# Patient Record
Sex: Female | Born: 1950 | Race: White | Hispanic: No | Smoking: Former smoker
Health system: Southern US, Community
[De-identification: ages and names within clinical notes are randomized; demographics above are authoritative.]

## PROBLEM LIST (undated history)

## (undated) DIAGNOSIS — F32A Depression, unspecified: Secondary | ICD-10-CM

## (undated) DIAGNOSIS — M2040 Other hammer toe(s) (acquired), unspecified foot: Secondary | ICD-10-CM

## (undated) DIAGNOSIS — F419 Anxiety disorder, unspecified: Secondary | ICD-10-CM

## (undated) DIAGNOSIS — C4492 Squamous cell carcinoma of skin, unspecified: Secondary | ICD-10-CM

## (undated) DIAGNOSIS — M797 Fibromyalgia: Secondary | ICD-10-CM

## (undated) DIAGNOSIS — F329 Major depressive disorder, single episode, unspecified: Secondary | ICD-10-CM

## (undated) DIAGNOSIS — K219 Gastro-esophageal reflux disease without esophagitis: Secondary | ICD-10-CM

## (undated) DIAGNOSIS — Z973 Presence of spectacles and contact lenses: Secondary | ICD-10-CM

## (undated) DIAGNOSIS — E039 Hypothyroidism, unspecified: Secondary | ICD-10-CM

## (undated) DIAGNOSIS — M1990 Unspecified osteoarthritis, unspecified site: Secondary | ICD-10-CM

## (undated) HISTORY — PX: COLONOSCOPY: SHX174

## (undated) HISTORY — PX: TONSILLECTOMY: SUR1361

## (undated) HISTORY — DX: Squamous cell carcinoma of skin, unspecified: C44.92

## (undated) HISTORY — DX: Other hammer toe(s) (acquired), unspecified foot: M20.40

## (undated) HISTORY — PX: HAMMER TOE SURGERY: SHX385

## (undated) HISTORY — PX: TAYLOR BUNIONECTOMY: SHX2485

---

## 1997-12-12 ENCOUNTER — Ambulatory Visit (HOSPITAL_COMMUNITY): Admission: RE | Admit: 1997-12-12 | Discharge: 1997-12-12 | Payer: Self-pay | Admitting: Orthopedic Surgery

## 1999-05-04 ENCOUNTER — Other Ambulatory Visit: Admission: RE | Admit: 1999-05-04 | Discharge: 1999-05-04 | Payer: Self-pay | Admitting: Obstetrics & Gynecology

## 2000-09-22 ENCOUNTER — Other Ambulatory Visit: Admission: RE | Admit: 2000-09-22 | Discharge: 2000-09-22 | Payer: Self-pay | Admitting: Obstetrics & Gynecology

## 2001-10-30 ENCOUNTER — Other Ambulatory Visit: Admission: RE | Admit: 2001-10-30 | Discharge: 2001-10-30 | Payer: Self-pay | Admitting: Obstetrics & Gynecology

## 2002-11-14 ENCOUNTER — Ambulatory Visit (HOSPITAL_COMMUNITY): Admission: RE | Admit: 2002-11-14 | Discharge: 2002-11-14 | Payer: Self-pay | Admitting: Obstetrics & Gynecology

## 2002-11-14 ENCOUNTER — Encounter (INDEPENDENT_AMBULATORY_CARE_PROVIDER_SITE_OTHER): Payer: Self-pay

## 2002-11-25 ENCOUNTER — Other Ambulatory Visit: Admission: RE | Admit: 2002-11-25 | Discharge: 2002-11-25 | Payer: Self-pay | Admitting: Obstetrics & Gynecology

## 2003-05-05 ENCOUNTER — Encounter (INDEPENDENT_AMBULATORY_CARE_PROVIDER_SITE_OTHER): Payer: Self-pay | Admitting: Specialist

## 2003-05-06 ENCOUNTER — Observation Stay (HOSPITAL_COMMUNITY): Admission: RE | Admit: 2003-05-06 | Discharge: 2003-05-07 | Payer: Self-pay | Admitting: Obstetrics & Gynecology

## 2004-04-06 ENCOUNTER — Other Ambulatory Visit: Admission: RE | Admit: 2004-04-06 | Discharge: 2004-04-06 | Payer: Self-pay | Admitting: Obstetrics & Gynecology

## 2004-04-20 ENCOUNTER — Encounter: Admission: RE | Admit: 2004-04-20 | Discharge: 2004-04-20 | Payer: Self-pay | Admitting: Gastroenterology

## 2004-08-22 HISTORY — PX: ABDOMINAL HYSTERECTOMY: SHX81

## 2005-06-02 ENCOUNTER — Other Ambulatory Visit: Admission: RE | Admit: 2005-06-02 | Discharge: 2005-06-02 | Payer: Self-pay | Admitting: Obstetrics & Gynecology

## 2007-08-23 HISTORY — PX: TOTAL KNEE ARTHROPLASTY: SHX125

## 2007-08-23 HISTORY — PX: JOINT REPLACEMENT: SHX530

## 2007-10-26 ENCOUNTER — Encounter: Admission: RE | Admit: 2007-10-26 | Discharge: 2007-10-26 | Payer: Self-pay

## 2008-04-15 ENCOUNTER — Inpatient Hospital Stay (HOSPITAL_COMMUNITY): Admission: RE | Admit: 2008-04-15 | Discharge: 2008-04-17 | Payer: Self-pay | Admitting: Orthopedic Surgery

## 2008-05-19 ENCOUNTER — Encounter: Admission: RE | Admit: 2008-05-19 | Discharge: 2008-05-19 | Payer: Self-pay | Admitting: Orthopedic Surgery

## 2008-05-26 ENCOUNTER — Inpatient Hospital Stay (HOSPITAL_COMMUNITY): Admission: RE | Admit: 2008-05-26 | Discharge: 2008-05-28 | Payer: Self-pay | Admitting: Orthopedic Surgery

## 2011-01-04 NOTE — Op Note (Signed)
NAME:  Felicia Cooper, Felicia Cooper NO.:  [id number]   MEDICAL RECORD NO.:  [id number]          PATIENT TYPE:  INP   LOCATION:  1606                         FACILITY:  [facility]   PHYSICIAN:  Madlyn Frankel. Charlann Boxer, M.D.  DATE OF BIRTH:  September 10, 1950   DATE OF PROCEDURE:  05/26/2008  DATE OF DISCHARGE:                               OPERATIVE REPORT   PREOPERATIVE DIAGNOSIS:  Right knee osteoarthritis.   POSTOPERATIVE DIAGNOSIS:  Right knee osteoarthritis.   PROCEDURE:  Right total knee replacement utilizing DePuy rotating  platform posterior stabilized knee component size 2 femur, 1.5 tibial  tray, 12.5  polyethylene insert to match the size 2 femur, 38 patellar  button.   SURGEON:  Madlyn Frankel. Charlann Boxer, M.D.   ASSISTANT:  Yetta Glassman. Loreta Ave, PA   ANESTHESIA:  Duramorph spinal.   DRAINS:  Times one Hemovac.   COMPLICATIONS:  None.   TOURNIQUET TIME:  40 minutes at 250 mmHg.   INDICATIONS:  Felicia Cooper is a 60 year old patient of mine with end-stage  bilateral knee osteoarthritis, predominantly patellofemoral joint.  She  had successfully underwent a left total knee replacement.  Given the  failure to respond to conservative measures as with the left side, she  wished to proceed with a right total knee replacement.  Risks and  benefits were reviewed  and prior consent obtained.   PROCEDURE IN DETAIL:  The patient was brought to operative theater.  Once adequate anesthesia, preoperative antibiotics, Ancef administered,  the patient was positioned supine with the right thigh tourniquet  placed.  The right lower extremity was then pre-scrubbed and prepped and  draped in a sterile fashion.  The leg was exsanguinated, tourniquet  elevated.  A midline incision was made followed by a median arthrotomy.  Following initial debridement, attention was directed to the patella.  The patient's patella was noted be significantly affected with the  lateral facet very sclerotic and grooved and  definitely less than 10 mm  in thickness.  The medial facet had a bit of a ridge and its height was  only 17 mm.  I resected down the medial facet giving good platform to  place a button on to a height of about 13 mm.  The 38 patellar button  fit well.  The lug holes were drilled.  There was a lug hole made into  the lateral facet, insurance and adequate stability.  A metal shim was  placed to protect this cut surface of the patella as I tended to the  remainder of the knee.   The femoral canal was opened with a drill and irrigated to prevent fat  emboli.  An intramedullary rod was then placed at 3 degrees of valgus.  I resected 10 mm of bone off the distal femur.   The tibia was then subluxated anteriorly and with an extramedullary rod,  I resected 10 mm of bone off the proximal lateral tibia.  At this point,  I checked with an extension block and was happy that knee was out in  extension and was again probably going to be a 12.5 poly as  I had used  in the contralateral lower extremity.   At this point, we went back to the femur, setting the rotation base off  the proximal cut surface of the tibia which was noted to be  perpendicular based on the alignment rod.   Once the rotation was set and pins placed, the final cutting block for  the size 2 femur was then placed anterior-posterior  and chamfer cuts  were made without difficulty nor notching.  A final box cut was made  based off the lateral aspect of the distal femur using the trial femur  as a guide.   Attention was now redirected back to the tibia and the tibia was  prepared for the size 1.5 tibial tray.  It was oriented into the medial  third of the tubercle, drilled and keel punched.  A trial reduction was  now carried out with the 2 femur, 1.5 tibia and at 12.5 insert, the knee  came to full extension.  The patella tracked without any application of  pressure through the trochlea.   All trial components were removed.  The  synovial capsule junction was  injected with 60 mL of 0.25% Marcaine with epinephrine and  1 mL of  Toradol.  The knee was then irrigated with normal saline pulse lavage.  Final components were opened and the cement mixed.   Final components were cemented into position and knee brought to  extension with the 12.5 trial insert in place.  Extruded cement was  removed.  Once the cement had cured, I removed the remaining cement out  the posterior aspect of the knee and placed the final 12.5 insert to  match the 2 femur.  The knee was re-irrigated and the Hemovac drain was  placed deep.  The extensor mechanism was then reapproximated using #1  Vicryl with the knee in flexion.  The remainder of the wound was closed  with 2-0 Vicryl and a running 4-0 Monocryl.  The knee was cleaned, dried  and dressed sterilely with Steri-Strips and a sterile bulky wrap.  She  was brought to the recovery room in stable condition tolerating the  procedure well.      Madlyn Frankel Charlann Boxer, M.D.  Electronically Signed     MDO/MEDQ  D:  05/26/2008  T:  05/26/2008  Job:  [id number]

## 2011-01-04 NOTE — H&P (Signed)
NAME:  Felicia Cooper, Felicia Cooper NO.:  [id number]   MEDICAL RECORD NO.:  [id number]          PATIENT TYPE:  INP   LOCATION:  NA                           [facility]ACILITY:  [facility]   PHYSICIAN:  Madlyn Frankel. Charlann Boxer, M.D.  DATE OF BIRTH:  [birth year]   DATE OF ADMISSION:  05/26/2008  DATE OF DISCHARGE:                              HISTORY & PHYSICAL   PROCEDURE:  Right total knee replacement.   SURGEON:  Madlyn Frankel. Charlann Boxer, M.D.   CHIEF COMPLAINT:  Right knee pain.   HISTORY OF PRESENT ILLNESS:  A 60 year old female with a history of  right knee pain secondary to osteoarthritis.  Refractory to all  conservative treatment.  She does have a recent history of a left total  knee replacement and is doing well.  She had been previously pre-  surgically assessed by her primary care physician, Dr. Henrine Screws.   PAST MEDICAL HISTORY:  1. Osteoarthritis.  2. Fibromyalgia.  3. Postmenopausal.  4. Hyperthyroidism.   PAST SURGICAL HISTORY:  1. Left total knee replacement in August, 2009.  2. Hysterectomy.  3. Hammer toe surgery.   FAMILY HISTORY:  Coronary artery disease.   SOCIAL HISTORY:  Divorced.  Primary caregiver will be in the home.   ALLERGIES:  No known drug allergies.   MEDICATIONS:  1. Wellbutrin 150 mg p.o. b.i.d.  2. Robaxin 500 mg p.o. q.6-8h. p.r.n. muscle spasm pain.  3. Gabapentin 300 mg p.o. b.i.d.  4. Paxil 25 mg p.o. daily.  5. Cenestin 1.25 mg 1 p.o. daily.  6. Celebrex 200 mg p.o. b.i.d.  7. Synthroid 0.075 mg 1 p.o. daily.  8. Aspirin 81 mg p.o. daily.   REVIEW OF SYSTEMS:  See HPI.   PHYSICAL EXAMINATION:  Pulse 72, respirations 16, blood pressure 124/82.  Height 5 feet.  Weight 185 pounds.  GENERAL:  Awake, alert and oriented.  Well-developed and well-nourished,  no acute distress.  NECK:  Supple.  No carotid bruits.  CHEST:  Lungs are clear to auscultation bilaterally.  BREASTS:  Deferred.  CARDIOVASCULAR:  Regular rate and rhythm.  S1 and S2  distinct.  ABDOMEN:  Soft, nontender, nondistended.  Bowel sounds present.  GENITOURINARY:  Deferred.  EXTREMITIES:  Right knee with crepitation and increased pain over the  patellofemoral region.  SKIN:  No cellulitis.  NEUROLOGIC:  Intact distal sensibilities.   LABORATORY DATA:  Labs, EKG, chest x-ray all pending pre-surgical  testing.  Recent EKG and chest x-ray performed prior to left total knee  replacement.   IMPRESSION:  Right knee osteoarthritis.   PLAN:  1. Right total knee arthroplasty at [facility] on May 26, 2008.  The risks and complications were discussed.  2. Postoperative medications including Lovenox, Robaxin, iron,      aspirin, Colace, and MiraLax were provided at the time of the      history and physical.     ______________________________  Yetta Glassman. Loreta Ave, [place]      Madlyn Frankel. Charlann Boxer, M.D.  Electronically Signed    BLM/MEDQ  D:  05/19/2008  T:  05/19/2008  Job:  [id number]   cc:   Chales Salmon. Abigail Miyamoto, M.D.  Fax: [phone]   Areatha Keas, M.D.  Fax: [phone]   W. Varney Baas, M.D.  Fax: [phone]

## 2011-01-04 NOTE — Op Note (Signed)
NAME:  TRELLIS, GUIRGUIS NO.:  [id number]   MEDICAL RECORD NO.:  [id number]          PATIENT TYPE:  INP   LOCATION:  0005                         FACILITY:  [facility]   PHYSICIAN:  Madlyn Frankel. Charlann Boxer, M.D.  DATE OF BIRTH:  Dec 21, 1950   DATE OF PROCEDURE:  04/15/2008  DATE OF DISCHARGE:                               OPERATIVE REPORT   PREOPERATIVE DIAGNOSIS:  Left knee osteoarthritis, predominantly  patellofemoral in a 60 year old female.   POSTOPERATIVE DIAGNOSIS:  Left knee osteoarthritis, predominantly  patellofemoral in a 60 year old female.   PROCEDURE:  Left total knee replacement.   COMPONENTS USED:  DePuy rotating platform posterior stabilized knee  system, size 2 femur, size 1.5 tibial tray with a 12.5 insert and a 38  patellar button.   SURGEON:  Madlyn Frankel. Charlann Boxer, M.D.   ASSISTANT:  Yetta Glassman. Mann, PA.   ANESTHESIA:  Duramorph spinal.   DRAINS:  Hemovac x1.   TOURNIQUET TIME:  Forty-two minutes at 250 mmHg.   BLOOD LOSS:  50 cc.   INDICATIONS FOR PROCEDURE:  Ms. Bowne is a 60 year old female who I  have been following for some time in the office for bilateral knee pain  and arthritis.   She has had persistent recurring problems despite conservative attempts  at managing it with cortisone injections and viscosupplementation.   I had a lengthy discussion with her regarding the surgical options, at  60 years of age and based on the amount of arthritis that is present in  this patellofemoral  and lateral compartment, I was worried about  anything other than total knee replacement versus a patellofemoral  replacement  or resurfacing.  The risks and benefits of this surgery  were discussed, including the risk of infection, DVT, component failure,  postoperative complaints for stiffness, the need for therapy.  Consent  was obtained.   PROCEDURE IN DETAIL:  Patient is brought to the operating theater.  Once  adequate anesthesia with preoperative  antibiotics, Ancef, were  administered, the patient was positioned supine on the left side.  A  tourniquet was placed through the left lower extremity, which was then  prepped and draped in a sterile fashion.  A midline incision was made  followed by a modified arthrotomy with patellar subluxation rather than  eversion.  Following initial  debridement,  I first attended  to the  patella.  Her patellar arthritis was very significant.  The precut  measurement was only about 16 mm at the high point with the lower  portion significantly affected by arthritis.  I used the saw and took it  down to about 13 mm, leaving a lot of bone, which we then subsequently  drilled the lug hole in addtion to smaller holes to help with cement  interdigiation.  It measured to be a 38 patellar button, and I drilled  the lug holes and then checked with my caliper and the patellar height  was restored to 23mm, which would be average for a female.   I kept this button in place to protect the cut surface from retractors  and the saw  blade   Attention was now directed to the femur.  The central canal was opened,  irrigated.  The intramedullary rod was placed.  At 3 degrees of valgus.  I resected 9 mm off the distal femur due to some preoperatative  hyperaxtension noted on exam under anesthesia.   Following this cut, I moved to the tibia.  The tibia was subluxated  anteriorly.  A meniscectomy was carried out, followed by removal of the  cruciate stumps.  Using an extramedullary guide, I resected 10 mm of  bone off the  proximal lateral tibia.  I then checked  with an extension  block to make sure a size 10 block would fit.   I now returned to the femur.  We sized the femur to be a size 2.  I then  placed an intramedullary rod to set  my rotation off the perpendicular  plane of the proximal tibia, which was checked with the appropriate jig.  The pins were placed.  The 4-in-1 cutting block was then slid over it.   I ended up dropping it down a little bit posteriorly to prevent  overstuffing it anteriorly and then the anterior, posterior  and chamfer  cuts were then made without notching.   We then made the box cut based off the lateral aspect of the distal  femur, making sure there is going to be no overhang.  Attention was then  redirected to the tibia.  The tibia was subluxated anteriorly.  I placed  a 2 tray initially, pinned it in place and then drilled and keel-punched  it.  A trial reduction is carried out.  I was happy with the size 12.5  poly.  The patella was noted to track through the trochlea without any  tilt and without pressure needed.   At this point, all trial components were removed.  We  irrigated the  knee with normal saline solution, pulse lavage, and injected  60 cc of  Marcaine with epinephrine and 1cc of toradol.  The cement was mixed as  the final components opened.  The final components were cemented into  position with extruded cement removed with the knee in extension.  Once  the cement cured and excessive cement was removed, I placed a final 12.5  x 2 x 2 insert.  Please note that this final tibial insert was a 1.5  even though the trial was a 2 as there was no trial available.  The 1.5  sat on the tibial tray without any overhang.   The knee was reirrigated, the tourniquet was let down at 43 minutes.  A  hemovac drain was placed deep.  The extensor mechanism was then  reapproximated using a #1 Vicryl with the knee in flexion, and the sub-G  layer with a 2-0 vicryl, and a running 4-0 Monocryl.  The knee was  cleaned, dried, and dressed sterilely.  Steri-Strips  and a sterile  bulky  dressing.  She was brought to the recovery room in stable  condition, having tolerated the procedure well.      Madlyn Frankel Charlann Boxer, M.D.  Electronically Signed     MDO/MEDQ  D:  04/15/2008  T:  04/15/2008  Job:  [id number]

## 2011-01-04 NOTE — H&P (Signed)
NAME:  Felicia Cooper, Felicia Cooper NO.:  [id number]   MEDICAL RECORD NO.:  [id number]          PATIENT TYPE:  INP   LOCATION:  NA                           FACILITY:  [facility]   PHYSICIAN:  Madlyn Frankel. Charlann Boxer, M.D.  DATE OF BIRTH:  1950-10-11   DATE OF ADMISSION:  04/15/2008  DATE OF DISCHARGE:                              HISTORY & PHYSICAL   PROCEDURE:  Left total knee arthroplasty.   CHIEF COMPLAINT:  Left knee pain.   HISTORY OF PRESENT ILLNESS:  A 60 year old female with a history of left  knee pain secondary to osteoarthritis.  It has been refractory to all  conservative treatment.  She has been previously assessed by her primary  care physician, Chales Salmon. Abigail Miyamoto, M.D.   ALLERGIES:  No known drug allergies.   CURRENT MEDICATIONS:  1. Wellbutrin 150 mg p.o. b.i.d.  2. Flexeril 10 mg half p.o. b.i.d. and 1 p.o. nightly.  3. Gabapentin 300 mg one p.o. b.i.d.  4. Paxil 25 mg one p.o. daily.  5. Canesten 1.25 mg 1 p.o. daily.  6. Meloxicam 50 mg 1 p.o. b.i.d.  7. Synthroid 0.075 mg 1 p.o. daily.   PAST MEDICAL HISTORY:  1. Osteoarthritis.  2. Fibromyalgia.  3. Postmenopausal.  4. Hypothyroidism.   PAST SURGICAL HISTORY:  1. Hysterectomy.  2. Hammertoe surgery.  3. Toe surgery in 1991.   FAMILY HISTORY:  Coronary artery disease   SOCIAL HISTORY:  Divorced.  Primary caregiver will be in the home  postoperatively.   REVIEW OF SYSTEMS:  See HPI.   PHYSICAL EXAMINATION:  VITAL SIGNS:  Pulse 72, respiratory rate 16,  blood pressure 116/78.  Height 5 feet, weight 185.  GENERAL APPEARANCE:  Awake, alert and oriented, well-developed, well-  nourished, no acute distress.  NECK:  Supple, no carotid bruits.  CHEST:  Lungs clear to auscultation bilaterally.  BREASTS:  Deferred.  CARDIOVASCULAR:  Regular rate and rhythm, S1 and S2 distinct.  ABDOMEN:  Soft, nontender, nondistended.  Bowel sounds present.  GENITOURINARY:  Deferred.  EXTREMITIES:  Left knee with  crepitation and increased pain of the  patellofemoral area.  SKIN:  No cellulitis.  NEUROLOGIC:  Intact distal sensibilities.   Labs, EKG and chest x-ray are all pending presurgical testing.   IMPRESSION:  Left knee osteoarthritis.   PLAN:  Left total knee arthroplasty at [facility] on April 15, 2008, by surgeon, Madlyn Frankel. Charlann Boxer, M.D.  Risks and complications  were discussed.   Postoperative medications including Lovenox, Robaxin, iron, aspirin,  Colace, MiraLax provided at the time of history and physical.  No pain  medicines provided, will be dispensed at time of surgery.     ______________________________  Yetta Glassman Loreta Ave, [place]      Madlyn Frankel. Charlann Boxer, M.D.  Electronically Signed    BLM/MEDQ  D:  04/04/2008  T:  04/04/2008  Job:  [id number]   cc:   Chales Salmon. Abigail Miyamoto, M.D.  Fax: [phone]   Areatha Keas, M.D.  Fax: [phone]   W. Varney Baas, M.D.  Fax: [phone]

## 2011-01-07 NOTE — Discharge Summary (Signed)
   NAME:  Felicia Cooper, Felicia Cooper                         ACCOUNT NO.:  [id number]   MEDICAL RECORD NO.:  [id number]                   PATIENT TYPE:  OBV   LOCATION:  9308                                 FACILITY:  [facility]   PHYSICIAN:  Freddy Finner, M.D.                DATE OF BIRTH:  1950-12-06   DATE OF ADMISSION:  05/06/2003  DATE OF DISCHARGE:  05/07/2003                                 DISCHARGE SUMMARY   DISCHARGE DIAGNOSES:  Uterine leiomyomata.   OPERATIVE PROCEDURE:  Laparoscopically assisted vaginal hysterectomy,  bilateral salpingo-oophorectomy.   COMPLICATIONS:  None.   DISPOSITION:  The patient was in satisfactory, improved condition at the  time of her discharge.  She was discharged home on Cenestin 1.25 mg daily.  She was given Percocet 5/325 to be taken for postoperative pain.  She is to  follow up in the office in approximately two weeks.  She is to call for  fever, severe pain, and bleeding.  She is to avoid heavy lifting or vaginal  entry.   Details of the present illness, family history, review of systems, and  physical examination are recorded in the admission note.   PHYSICAL EXAMINATION:  PELVIC:  Remarkable for enlargement of the uterus.   LABORATORY DATA:  CBC on admission with hemoglobin 13.9, postoperative  hemoglobin 11.7.  Admission prothrombin time and PTT were normal.  Admission  urinalysis was normal.   HOSPITAL COURSE:  The patient was admitted on day of surgery.  She was  treated perioperatively with PAS hose without antibiotics.  The above  described procedure was accomplished without difficulty.  Her postoperative  course was uncomplicated.  She remained afebrile throughout.  By the evening  of the first postoperative day she was discharged home with disposition as  noted above.                                               Freddy Finner, M.D.    WRN/MEDQ  D:  05/24/2003  T:  05/26/2003  Job:  [id number]

## 2011-01-07 NOTE — Discharge Summary (Signed)
NAME:  Felicia Cooper, Felicia Cooper NO.:  [id number]   MEDICAL RECORD NO.:  [id number]          PATIENT TYPE:  INP   LOCATION:  1606                         FACILITY:  [facility]   PHYSICIAN:  Madlyn Frankel. Charlann Boxer, M.D.  DATE OF BIRTH:  [birth year]   DATE OF ADMISSION:  05/26/2008  DATE OF DISCHARGE:  05/28/2008                               DISCHARGE SUMMARY   ADMISSION DIAGNOSES:  1. Osteoarthritis.  2. Fibromyalgia.  3. Postmenopausal.  4. Hyperthyroidism.   DISCHARGE DIAGNOSES:  1. Osteoarthritis.  2. Fibromyalgia.  3. Postmenopausal.  4. Hyperthyroidism.  5. Postoperative hypokalemia.   HISTORY OF PRESENT ILLNESS:  A 60 year old female with a history of  right knee pain secondary to osteoarthritis refractory to all  conservative treatment.  A recent history of a left total knee  replacement.  Has done extremely well.   CONSULTS:  None.   PROCEDURE:  Right total knee replacement by surgeon, Dr. Durene Romans  and assistant, Dwyane Luo, PA-C.   LABORATORY DATA:  Final CBC showed her white blood cells of 5.2,  hemoglobin 10, hematocrit 29.1, and platelets of 191.  Final metabolic  panel shows sodium of 136, potassium 3.3, creatinine 0.54, glucose 103.  Coagulation normal.  UA was negative for nitrites.   RADIOLOGY:  Chest, two view, no active disease.   CARDIOLOGY:  EKG, normal sinus rhythm.   HOSPITAL COURSE:  Patient admitted to the hospital and underwent a right  total knee replacement.  Admitted to the orthopedic floor.  The first  day was unremarkable.  She remained hemodynamically stable throughout  her stay.  The dressing was changed after day #1 with findings of  minimal serosanguineous drainage.  Hemovac was discontinued on day #1.  She made good progress with physical therapy and met all criteria for  discharge by day #2.  She was given some K-Dur for some postoperative  hypokalemia on day #2.  This is mild in nature, related volume  depletion.  Home care was  selected, and she was discharged.   DISCHARGE DISPOSITION:  Discharged home with home healthcare PT in  stable condition.   DISCHARGE DIET:  Regular.   DISCHARGE WOUND CARE:  Keep dry.   DISCHARGE MEDICATIONS:  1. Lovenox 40 mg subcu q.24h. x11 days.  2. Robaxin 500 mg p.o. q.6h. muscle spasm.  3. Enteric-coated aspirin 325 mg p.o. daily after Lovenox.  4. Iron 325 mg 1 p.o. t.i.d. x1 week.  5. Colace 100 mg 1 p.o. b.i.d. p.r.n. constipation.  6. MiraLax 15 gm p.o. daily p.r.n. constipation.  7. Oxycodone 5 mg 1-3 p.o. q.3-4h. p.r.n. pain.  8. Phenergan 12.5 mg 1 p.o. q.8h. p.r.n. nausea and vomiting.  9. Wellbutrin 150 mg p.o. b.i.d.  10.Neurontin 300 mg 2 tablets nightly.  11.Paxil 25 mg 1 p.o. q.a.m.  12.Cenestin 1.25 mg 1 p.o. q.p.m.  13.Synthroid 0.075 mg or 75 mcg 1 p.o. daily.  14.Methocarbamol 500mg  PO Q.I.D prn spasm and pain.   DISCHARGE FOLLOWUP:  Follow up with Dr. Charlann Boxer at phone number [phone] in  two weeks.   DISCHARGE PHYSICAL THERAPY:  Weightbearing as  tolerated.  .     ______________________________  Yetta Glassman Loreta [address], [place]      Madlyn Frankel. Charlann Boxer, M.D.  Electronically Signed    BLM/MEDQ  D:  06/19/2008  T:  06/19/2008  Job:  [id number]   cc:   Chales Salmon. Abigail Miyamoto, M.D.  Fax: [phone]

## 2011-01-07 NOTE — Op Note (Signed)
NAME:  Felicia Cooper, Felicia Cooper                         ACCOUNT NO.:  [id number]   MEDICAL RECORD NO.:  [id number]                   PATIENT TYPE:  OBV   LOCATION:  9308                                 FACILITY:  [facility]   PHYSICIAN:  Freddy Finner, M.D.                DATE OF BIRTH:  December 06, 1950   DATE OF PROCEDURE:  05/06/2003  DATE OF DISCHARGE:                                 OPERATIVE REPORT   PREOPERATIVE DIAGNOSIS:  Fibroids, menorrhagia.   POSTOPERATIVE DIAGNOSIS:  Fibroids, menorrhagia.   OPERATIVE PROCEDURE:  Laparoscopic assisted vaginal hysterectomy, bilateral  salpingo-oophorectomy.   SURGEON:  Freddy Finner, M.D.   ASSISTANT:  Duke Salvia. Marcelle Overlie, M.D.   ESTIMATED INTRAOPERATIVE BLOOD LOSS:  150 cc.   ANESTHESIA:  General endotracheal.   INTRAOPERATIVE COMPLICATIONS:  None.   DESCRIPTION OF PROCEDURE:  The patient was admitted on the morning of  surgery. She was placed on __________. She was given a 1 g bolus of  cefotetan IV. She was brought to the operating room. She was placed under  general anesthesia, placed in a dorsal lithotomy position using the [employer] system. Betadine prep of the abdomen, perineum, and vagina was  carried out with Betadine scrub followed by Betadine solution. Bladder was  evacuated with a sterile Robinson catheter. Hulka tenaculum was attached to  the cervix without difficulty. Sterile drapes were applied. Two small  incisions were made in the abdomen, one at the umbilicus and one just above  the symphysis. An 11-mm nonbladed disposable trocar was introduced into the  umbilicus. Direct inspection revealed adequate placement and no evidence of  injury on entry. Pneumoperitoneum was allowed to accumulate using carbon  dioxide gas. A 5-mm trocar was placed through the lower incision under  direct visualization. A systematic examination of the abdominal and pelvic  contents was carried out. Appendix was visualized. There were a few little  adhesions of the appendix to the peritoneum, but overall the appendix  appeared to be normal. Pelvic structures were normal except for the presence  of fibroids which enlarged the uterus. Grasping forceps was placed in the  lower trocar sleeve, and the tube and ovary on the right side elevated and  placed in traction. Using the Gyrus tripolar device,  the infundibular  pelvic ligament and upper broad ligament on that side were progressively  fulgurated and divided to a level approximately just above the uterine  arteries. The left side was then treated in an identical fashion. Attention  was then turned vaginally. Hulka tenaculum was removed. A posterior wide  retractor was placed. Cervix was grasped with the [facility] tenaculum.  Colpotomy incision was made while tenting the mucosa posterior to the  cervix. Cervix was released from the mucosa with a scalpel. Uterosacral  ligaments were then taken with a ligature system, sealed and divided.  Bladder pillars were similarly taken, sealed and divided. Bladder was  further advanced off the cervix. Cardinal ligament pedicles were taken with  the ligature system sealed and divided. Anterior peritoneum was entered.  Vessel pedicles on each side were taken with the ligature system sealed and  divided. A second pedicle was taken above the vessels on each side. The  uterus was then delivered through the vaginal introitus. Remaining pedicle  in the patient's right was then sealed and divided, releasing the uterus  completely. Angles of the vagina were then anchored to uterosacral pedicles  with a 0 Monocryl mattress suture. Uterosacrals were plicated and posterior  peritoneum closed with an interrupted 0 Monocryl suture. Cuff was closed  vertically with figure-of-eights of 0 Monocryl. Foley catheter was placed.  Reinspection laparoscopically was carried out. All bleeding sources noted  were sealed with a Gyrus. Irrigating solution was used __________.  All  irrigating solution was aspirated. When hemostasis was achieved, all  instruments were removed, gas was allowed to escape from the abdomen. Skin  incisions were closed with interrupted subcuticular sutures of 3-0 Dexon.  Half percent plain Marcaine was injected at the incision sites for  postoperative analgesia. Steri strips were applied at the lower incision.  The patient was awakened and taken to the recovery room in good condition.                                               Freddy Finner, M.D.    WRN/MEDQ  D:  05/06/2003  T:  05/06/2003  Job:  [id number]

## 2011-01-07 NOTE — H&P (Signed)
NAME:  Felicia Cooper, Felicia Cooper                         ACCOUNT NO.:  [id number]   MEDICAL RECORD NO.:  [id number]                   PATIENT TYPE:  AMB   LOCATION:  SDC                                  FACILITY:  [facility]   PHYSICIAN:  Freddy Finner, M.D.                DATE OF BIRTH:  [birth year]   DATE OF ADMISSION:  DATE OF DISCHARGE:                                HISTORY & PHYSICAL   ANTICIPATED DATE OF ADMISSION:  May 06, 2003   ADMITTING DIAGNOSES:  1. Uterine fibroids.  2. Dysfunctional uterine bleeding unresponsive to oral contraceptives.   HISTORY OF PRESENT ILLNESS:  The patient is a 60 year old white married  female gravida 4, para 2 who has been followed in my office since 1982.  She  did have the finding of cervical dysplasia approximately 12 years ago, which  was adequately treated with cryocautery of the cervix.  She has otherwise  been normal and has had no recurrent abnormal Pap smears. She presented in  March of this year fracture her regular annual examination at which time she  complained of severe dysmenorrhea and menorrhagia.  A sonohystogram was  performed showing and endometrial polyp and showing intramural leiomyomata.  She had hysteroscopy and D&C at this hospital in March of 2004 and the  endometrial polyp was resected.  She was continued on oral contraceptives as  a way of cycling.  She has continued to have abnormal bleeding in spite of  the oral contraceptives.  She specifically requested consideration for  definitive intervention.  She is now admitted for laparoscopically assisted  vaginal hysterectomy and bilateral salpingo-oophorectomy.  She has reviewed  the video in the office.  She has been carefully counselled about the  procedure including the potential risks.  She is now admitted for surgery.   PAST MEDICAL HISTORY:  The patient has no known significant medical  illnesses, except for hypothyroidism for which she takes Synthroid.  She  also has  fibromyalgia for which she takes at the present time Prozac,  Wellbutrin and Neurontin.  She is followed for this by Dr. Britta Mccreedy.  She  has no other known significant medical illnesses.   ALLERGIES:  No known allergies to medications.   MEDICATIONS:  The above medications are her current meds.   PAST HISTORY:  The patient has never had a blood transfusion.   SOCIAL HISTORY:  The patient does not use cigarettes.  She uses alcohol only  socially.   PAST SURGICAL HISTORY:  Previous surgical procedures include the ones noted  above, also she had a T&A as a child.  She had two vaginal births.  She had  one TAB and a D&C with a spontaneous abortion in 40.   FAMILY HISTORY:  Family history is noncontributory.   PHYSICAL EXAMINATION:  HEENT:  Grossly within normal limits.  NECK:  Thyroid gland is not palpably enlarged to my examination.  VITAL SIGNS:  The patient's blood pressure in the office is 106/56.  BREASTS:  Breast exam is considered to be normal.  There are no palpable  masses.  No skin change.  No nipple discharge.  HEART:  Normal sinus rhythm without murmurs, rubs or gallops.  CHEST:  Chest is clear to auscultation.  ABDOMEN:  Abdomen is soft and nontender without appreciable organomegaly or  palpable masses on exam.  PELVIC EXAMINATION:  External genitalia:  Vagina and cervix are normal.  Bimanual reveals slightly enlargement of the uterus to deep palpation.  There are no palpable adnexal masses.  RECTAL:  The rectum is normal. Rectovaginal exam confirms the above-  findings.  EXTREMITIES:  Without cyanosis, clubbing or edema.   ASSESSMENT:  1. Uterine leiomyomata.  2. Dysfunctional uterine bleeding unresponsive to cyclic hormonal therapy.   PLAN:  Laparoscopically assisted vaginal hysterectomy and bilateral salpingo-  oophorectomy.                                                 Freddy Finner, M.D.    WRN/MEDQ  D:  05/05/2003  T:  05/06/2003  Job:   [id number]

## 2011-01-07 NOTE — Op Note (Signed)
   NAME:  Felicia Cooper, Felicia Cooper                         ACCOUNT NO.:  [id number]   MEDICAL RECORD NO.:  [id number]                   PATIENT TYPE:  AMB   LOCATION:  SDC                                  FACILITY:  [facility]   PHYSICIAN:  Freddy Finner, M.D.                DATE OF BIRTH:  February 21, 1951   DATE OF PROCEDURE:  11/14/2002  DATE OF DISCHARGE:                                 OPERATIVE REPORT   PREOPERATIVE DIAGNOSES:  1. Dysfunctional uterine bleeding.  2. Endometrial polyp by sonohysterogram.   POSTOPERATIVE DIAGNOSES:  1. Dysfunctional uterine bleeding.  2. Visualization of polyp.   OPERATIVE PROCEDURES:  1. Hysteroscopy.  2. Dilatation and curettage.  3. Resection of polyp.   SURGEON:  Freddy Finner, M.D.   ESTIMATED INTRAOPERATIVE BLOOD LOSS:  10 mL.   SORBITOL DEFICIT:  45 mL.   INTRAOPERATIVE COMPLICATIONS:  None.   INDICATIONS:  The patient is a 59 year old with dysfunctional uterine  bleeding, who had sonohysterogram showing an endometrial polyp.  She is  admitted now for hysteroscopy.   DESCRIPTION OF PROCEDURE:  She was admitted on the morning of surgery,  brought to the operating room, placed under adequate general anesthesia, and  placed in the dorsolithotomy position using the [place] stirrup system.  Betadine prep of the perineum and vagina was carried out in the usual  fashion.  Sterile drapes were applied.  A bivalve speculum was introduced.  The cervix was grasped with a single-tooth tenaculum.  The uterus sounded  8.5 cm.  The cervix was progressively dilated with Pratts to 23.  The 12.5  degree [facility] hysteroscope was introduced using Sorbitol 3% as the distending  medium.  Inspection revealed a polyp which was photographed.  The  photographs were retained in the office record.  Exploration with polyp  forceps.  Gentle thorough curettage and repeat exploration with the polyp  forceps resected the polyp and sampled the endometrium.  The material was  submitted  as one sample for hysterologic examination.  The procedure at this  point was terminated and the instruments were removed.  The patient was  awaken and taken to the recovery room in good condition.  She will be  discharged with routine outpatient surgical instructions for follow-up in  the office in approximately one week.                                               Freddy Finner, M.D.   WRN/MEDQ  D:  11/14/2002  T:  11/14/2002  Job:  [id number]

## 2011-01-07 NOTE — Discharge Summary (Signed)
NAME:  Felicia Cooper, Felicia Cooper NO.:  [id number]   MEDICAL RECORD NO.:  [id number]          PATIENT TYPE:  INP   LOCATION:  1613                         [facility]ACILITY:  [facility]   PHYSICIAN:  Madlyn Frankel. Charlann Boxer, M.D.  DATE OF BIRTH:  07-31-1951   DATE OF ADMISSION:  04/15/2008  DATE OF DISCHARGE:  04/17/2008                               DISCHARGE SUMMARY   ADMITTING DIAGNOSES:  1. Osteoarthritis.  2. Fibromyalgia.  3. Postmenopausal.  4. Hypothyroidism.   DISCHARGE DIAGNOSES:  1. Osteoarthritis.  2. Fibromyalgia.  3. Postmenopausal.  4. Hypothyroidism.  5. Postoperative hypokalemia.   HISTORY OF PRESENT ILLNESS:  A 60 year old female history of left knee  pain secondary to osteoarthritis.  Refractory to all conservative  treatment.   CONSULTATION:  None.   PROCEDURE:  Left total knee arthroplasty by surgeon Dr. Durene Romans,  assistant Dwyane Luo, PA.   LABORATORY DATA:  CBC upon admission:  Hematocrit 37.4, platelets 247.  Last reading:  Hematocrit 30.5, platelets 200 and stable.  White cell  differential all within normal limits.  Coagulation all within normal  limits.  Routine chemistry upon admission:  No significant  abnormalities.  At time of discharge her last reading was sodium 138,  potassium 3.4, glucose 107, creatinine 0.59.  Her kidney function was  good with GFR greater than 60 and the last calcium 8.5.  Her UA was  negative.   RADIOLOGY:  Chest two-view no active disease.   Cardiology EKG showed normal sinus rhythm.   HOSPITAL COURSE:  The patient underwent left total knee replacement,  admitted to Orthopedic Floor.  Her course of stay was unremarkable.  She  remained hemodynamically stable other than some mild hypokalemia which  was treated with K-Dur and resolved.  Her dressing was changed on a  daily basis after Hemovac was discontinued.  Her wound showed no sign of  infection, no serosanguineous ooze.  She remained neurovascularly intact  in the  left lower extremity throughout, was able to do straight leg  raise prior to discharge.  Seen on day 2 doing well, making due progress  in physical therapy.  Was motivated and meeting all criteria for  discharge home.   DISCHARGE DISPOSITION:  Discharged home in stable improved condition  with Home Health Care PT.   DISCHARGE PHYSICAL THERAPY:  Weightbearing as tolerated with use of a  rolling walker.   DISCHARGE DIET:  Regular.   DISCHARGE WOUND CARE:  Keep dry.   DISCHARGE MEDICATIONS:  1. Lovenox 40 mg subcutaneous q. 24 times eleven days.  2. Robaxin 5 mg p.o. q.6 p.r.n. muscle spasm/pain.  3. Iron 325 mg p.o. q.6..  4. Iron 325 mg one p.o. t.i.d. x 1 week.  5. Colace 100 mg p.o. b.i.d.  6. MiraLax 17 grams p.o. daily.  7. Vicodin 7.5/325 one to two p.o. q.4-6 p.r.n. pain.  8. Wellbutrin 150 mg p.o. b.i.d.  9. Cyclobenzaprine, hold.  10.Gabapentin 300 mg p.o. nightly.  11.Paxil 25 mg p.o. q.a.m.  12.Meloxicam 50 mg p.o. b.i.d.  13.Synthroid 0.075 mg p.o. q.a.m.  14.Estradiol 2 mg p.o. nightly.  15.Celebrex 200 mg one p.o. b.i.d. x2 weeks after surgery.   DISCHARGE FOLLOWUP:  Follow up with Dr. Charlann Boxer at phone number [phone] in  two weeks for wound check.     ______________________________  Yetta Glassman. Loreta [address], [place]      Madlyn Frankel. Charlann Boxer, M.D.  Electronically Signed    BLM/MEDQ  D:  06/03/2008  T:  06/03/2008  Job:  [id number]   cc:   Chales Salmon. Abigail Miyamoto, M.D.  Fax: [phone]   W. Varney Baas, M.D.  Fax: [phone]

## 2011-05-23 LAB — BASIC METABOLIC PANEL
BUN: 12
CO2: 30
Calcium: 9.2
GFR calc Af Amer: >60
Potassium: 3.8
Sodium: 139

## 2011-05-23 LAB — URINALYSIS, ROUTINE W REFLEX MICROSCOPIC
Hgb urine dipstick: NEGATIVE
Specific Gravity, Urine: 1.015

## 2011-05-23 LAB — CBC
MCV: 92.5
WBC: 12.9 — ABNORMAL HIGH

## 2011-05-23 LAB — DIFFERENTIAL
Basophils Absolute: 0.1
Lymphocytes Relative: 12
Neutro Abs: 10.7 — ABNORMAL HIGH
Neutrophils Relative %: 83 — ABNORMAL HIGH

## 2011-05-23 LAB — PROTIME-INR
INR: 0.9
Prothrombin Time: 12.6

## 2011-05-23 LAB — APTT: aPTT: 26

## 2011-05-24 LAB — CBC
HCT: 29.1 — ABNORMAL LOW
Hemoglobin: 10 — ABNORMAL LOW
Hemoglobin: 10.6 — ABNORMAL LOW
MCHC: 33.5
MCV: 90.9
MCV: 92.5
RBC: 3.2 — ABNORMAL LOW
RBC: 3.41 — ABNORMAL LOW
RDW: 12.8
RDW: 12.9
WBC: 5.2

## 2011-05-24 LAB — BASIC METABOLIC PANEL
BUN: 7
CO2: 31
Calcium: 8 — ABNORMAL LOW
Chloride: 97
GFR calc Af Amer: >60
Glucose, Bld: 120 — ABNORMAL HIGH
Potassium: 4
Sodium: 133 — ABNORMAL LOW

## 2011-09-27 ENCOUNTER — Other Ambulatory Visit: Payer: Self-pay | Admitting: Gastroenterology

## 2012-10-31 ENCOUNTER — Other Ambulatory Visit: Payer: Self-pay | Admitting: Physician Assistant

## 2013-04-05 ENCOUNTER — Other Ambulatory Visit: Payer: Self-pay | Admitting: Orthopedic Surgery

## 2013-04-05 ENCOUNTER — Encounter (HOSPITAL_BASED_OUTPATIENT_CLINIC_OR_DEPARTMENT_OTHER): Payer: Self-pay | Admitting: *Deleted

## 2013-04-05 NOTE — Progress Notes (Signed)
No labs needed

## 2013-04-11 ENCOUNTER — Ambulatory Visit (HOSPITAL_BASED_OUTPATIENT_CLINIC_OR_DEPARTMENT_OTHER)
Admission: RE | Admit: 2013-04-11 | Discharge: 2013-04-11 | Disposition: A | Discharge status: Home / Self Care | Payer: BC Managed Care – PPO | Source: Ambulatory Visit | Attending: Orthopedic Surgery | Admitting: Orthopedic Surgery

## 2013-04-11 ENCOUNTER — Encounter (HOSPITAL_BASED_OUTPATIENT_CLINIC_OR_DEPARTMENT_OTHER)
Admission: RE | Disposition: A | Discharge status: Home / Self Care | Payer: Self-pay | Source: Ambulatory Visit | Attending: Orthopedic Surgery

## 2013-04-11 ENCOUNTER — Encounter (HOSPITAL_BASED_OUTPATIENT_CLINIC_OR_DEPARTMENT_OTHER): Payer: Self-pay | Admitting: *Deleted

## 2013-04-11 ENCOUNTER — Ambulatory Visit (HOSPITAL_BASED_OUTPATIENT_CLINIC_OR_DEPARTMENT_OTHER): Payer: BC Managed Care – PPO | Admitting: *Deleted

## 2013-04-11 DIAGNOSIS — Z79899 Other long term (current) drug therapy: Secondary | ICD-10-CM | POA: Diagnosis not present

## 2013-04-11 DIAGNOSIS — F411 Generalized anxiety disorder: Secondary | ICD-10-CM | POA: Diagnosis not present

## 2013-04-11 DIAGNOSIS — F329 Major depressive disorder, single episode, unspecified: Secondary | ICD-10-CM | POA: Diagnosis not present

## 2013-04-11 DIAGNOSIS — F3289 Other specified depressive episodes: Secondary | ICD-10-CM | POA: Diagnosis not present

## 2013-04-11 DIAGNOSIS — E039 Hypothyroidism, unspecified: Secondary | ICD-10-CM | POA: Insufficient documentation

## 2013-04-11 DIAGNOSIS — M129 Arthropathy, unspecified: Secondary | ICD-10-CM | POA: Diagnosis not present

## 2013-04-11 DIAGNOSIS — Z87891 Personal history of nicotine dependence: Secondary | ICD-10-CM | POA: Diagnosis not present

## 2013-04-11 DIAGNOSIS — Z6837 Body mass index (BMI) 37.0-37.9, adult: Secondary | ICD-10-CM | POA: Insufficient documentation

## 2013-04-11 DIAGNOSIS — Z96659 Presence of unspecified artificial knee joint: Secondary | ICD-10-CM | POA: Diagnosis not present

## 2013-04-11 DIAGNOSIS — IMO0001 Reserved for inherently not codable concepts without codable children: Secondary | ICD-10-CM | POA: Diagnosis not present

## 2013-04-11 DIAGNOSIS — M1990 Unspecified osteoarthritis, unspecified site: Secondary | ICD-10-CM | POA: Diagnosis not present

## 2013-04-11 DIAGNOSIS — G5600 Carpal tunnel syndrome, unspecified upper limb: Secondary | ICD-10-CM | POA: Insufficient documentation

## 2013-04-11 HISTORY — DX: Hypothyroidism, unspecified: E03.9

## 2013-04-11 HISTORY — PX: CARPAL TUNNEL RELEASE: SHX101

## 2013-04-11 HISTORY — DX: Fibromyalgia: M79.7

## 2013-04-11 HISTORY — DX: Depression, unspecified: F32.A

## 2013-04-11 HISTORY — DX: Major depressive disorder, single episode, unspecified: F32.9

## 2013-04-11 HISTORY — DX: Anxiety disorder, unspecified: F41.9

## 2013-04-11 HISTORY — DX: Presence of spectacles and contact lenses: Z97.3

## 2013-04-11 HISTORY — DX: Unspecified osteoarthritis, unspecified site: M19.90

## 2013-04-11 LAB — POCT HEMOGLOBIN-HEMACUE: Hemoglobin: 13.9 g/dL (ref 12.0–15.0)

## 2013-04-11 SURGERY — CARPAL TUNNEL RELEASE
Anesthesia: General | Site: Wrist | Laterality: Left | Wound class: Clean

## 2013-04-11 MED ORDER — FENTANYL CITRATE 0.05 MG/ML IJ SOLN
INTRAMUSCULAR | Status: DC | PRN
  Administered 2013-04-11 (×2): 50 ug via INTRAVENOUS

## 2013-04-11 MED ORDER — PROPOFOL 10 MG/ML IV BOLUS
INTRAVENOUS | Status: DC | PRN
  Administered 2013-04-11: 50 mg via INTRAVENOUS
  Administered 2013-04-11: 200 mg via INTRAVENOUS

## 2013-04-11 MED ORDER — MIDAZOLAM HCL 2 MG/2ML IJ SOLN: 1.0000 mg | INTRAMUSCULAR | Status: DC | PRN

## 2013-04-11 MED ORDER — LIDOCAINE HCL 2 % IJ SOLN
INTRAMUSCULAR | Status: DC | PRN
  Administered 2013-04-11: 2 mL

## 2013-04-11 MED ORDER — ONDANSETRON HCL 4 MG/2ML IJ SOLN: 4.0000 mg | Freq: Once | INTRAMUSCULAR | Status: DC | PRN

## 2013-04-11 MED ORDER — LIDOCAINE HCL (CARDIAC) 20 MG/ML IV SOLN
INTRAVENOUS | Status: DC | PRN
  Administered 2013-04-11: 80 mg via INTRAVENOUS

## 2013-04-11 MED ORDER — FENTANYL CITRATE 0.05 MG/ML IJ SOLN: 50.0000 ug | INTRAMUSCULAR | Status: DC | PRN

## 2013-04-11 MED ORDER — MIDAZOLAM HCL 5 MG/5ML IJ SOLN
INTRAMUSCULAR | Status: DC | PRN
  Administered 2013-04-11: 1 mg via INTRAVENOUS

## 2013-04-11 MED ORDER — OXYCODONE-ACETAMINOPHEN 5-325 MG PO TABS
ORAL_TABLET | ORAL | Status: DC
Start: 2013-04-11 — End: 2013-06-10

## 2013-04-11 MED ORDER — OXYCODONE HCL 5 MG/5ML PO SOLN: 5.0000 mg | Freq: Once | ORAL | Status: DC | PRN

## 2013-04-11 MED ORDER — CHLORHEXIDINE GLUCONATE 4 % EX LIQD: 60.0000 mL | Freq: Once | CUTANEOUS | Status: DC

## 2013-04-11 MED ORDER — LACTATED RINGERS IV SOLN
INTRAVENOUS | Status: DC
  Administered 2013-04-11 (×2): via INTRAVENOUS

## 2013-04-11 MED ORDER — HYDROMORPHONE HCL PF 1 MG/ML IJ SOLN: 0.2500 mg | INTRAMUSCULAR | Status: DC | PRN

## 2013-04-11 MED ORDER — ONDANSETRON HCL 4 MG/2ML IJ SOLN
INTRAMUSCULAR | Status: DC | PRN
  Administered 2013-04-11: 4 mg via INTRAVENOUS

## 2013-04-11 MED ORDER — OXYCODONE HCL 5 MG PO TABS: 5.0000 mg | ORAL_TABLET | Freq: Once | ORAL | Status: DC | PRN

## 2013-04-11 MED ORDER — DEXAMETHASONE SODIUM PHOSPHATE 10 MG/ML IJ SOLN
INTRAMUSCULAR | Status: DC | PRN
  Administered 2013-04-11: 10 mg via INTRAVENOUS

## 2013-04-11 SURGICAL SUPPLY — 42 items
BANDAGE ADHESIVE 1X3 (GAUZE/BANDAGES/DRESSINGS) IMPLANT
BANDAGE ELASTIC 3 VELCRO ST LF (GAUZE/BANDAGES/DRESSINGS) ×2 IMPLANT
BLADE SURG 15 STRL LF DISP TIS (BLADE) ×1 IMPLANT
BLADE SURG 15 STRL SS (BLADE) ×2
BNDG CMPR 9X4 STRL LF SNTH (GAUZE/BANDAGES/DRESSINGS) ×1
BNDG ESMARK 4X9 LF (GAUZE/BANDAGES/DRESSINGS) ×1 IMPLANT
BRUSH SCRUB EZ PLAIN DRY (MISCELLANEOUS) ×2 IMPLANT
CLOTH BEACON ORANGE TIMEOUT ST (SAFETY) ×2 IMPLANT
CORDS BIPOLAR (ELECTRODE) ×1 IMPLANT
COVER MAYO STAND STRL (DRAPES) ×2 IMPLANT
COVER TABLE BACK 60X90 (DRAPES) ×2 IMPLANT
CUFF TOURNIQUET SINGLE 18IN (TOURNIQUET CUFF) ×1 IMPLANT
DECANTER SPIKE VIAL GLASS SM (MISCELLANEOUS) IMPLANT
DRAPE EXTREMITY T 121X128X90 (DRAPE) ×2 IMPLANT
DRAPE SURG 17X23 STRL (DRAPES) ×2 IMPLANT
GLOVE BIOGEL M STRL SZ7.5 (GLOVE) ×1 IMPLANT
GLOVE BIOGEL PI IND STRL 7.0 (GLOVE) IMPLANT
GLOVE BIOGEL PI INDICATOR 7.0 (GLOVE) ×1
GLOVE ECLIPSE 6.5 STRL STRAW (GLOVE) ×1 IMPLANT
GLOVE EXAM NITRILE LRG STRL (GLOVE) ×1 IMPLANT
GLOVE ORTHO TXT STRL SZ7.5 (GLOVE) ×2 IMPLANT
GOWN BRE IMP PREV XXLGXLNG (GOWN DISPOSABLE) ×3 IMPLANT
GOWN PREVENTION PLUS XLARGE (GOWN DISPOSABLE) ×2 IMPLANT
NDL SAFETY ECLIPSE 18X1.5 (NEEDLE) IMPLANT
NEEDLE 27GAX1X1/2 (NEEDLE) ×1 IMPLANT
NEEDLE HYPO 18GX1.5 SHARP (NEEDLE) ×2
PACK BASIN DAY SURGERY FS (CUSTOM PROCEDURE TRAY) ×2 IMPLANT
PAD CAST 3X4 CTTN HI CHSV (CAST SUPPLIES) ×1 IMPLANT
PADDING CAST ABS 4INX4YD NS (CAST SUPPLIES)
PADDING CAST ABS COTTON 4X4 ST (CAST SUPPLIES) ×1 IMPLANT
PADDING CAST COTTON 3X4 STRL (CAST SUPPLIES) ×2
SPLINT PLASTER CAST XFAST 3X15 (CAST SUPPLIES) ×5 IMPLANT
SPLINT PLASTER XTRA FASTSET 3X (CAST SUPPLIES) ×5
SPONGE GAUZE 4X4 12PLY (GAUZE/BANDAGES/DRESSINGS) ×2 IMPLANT
STOCKINETTE 4X48 STRL (DRAPES) ×2 IMPLANT
STRIP CLOSURE SKIN 1/2X4 (GAUZE/BANDAGES/DRESSINGS) ×2 IMPLANT
SUT PROLENE 3 0 PS 2 (SUTURE) ×2 IMPLANT
SYR 3ML 23GX1 SAFETY (SYRINGE) IMPLANT
SYR CONTROL 10ML LL (SYRINGE) ×1 IMPLANT
TOWEL OR 17X24 6PK STRL BLUE (TOWEL DISPOSABLE) ×2 IMPLANT
TRAY DSU PREP LF (CUSTOM PROCEDURE TRAY) ×2 IMPLANT
UNDERPAD 30X30 INCONTINENT (UNDERPADS AND DIAPERS) ×2 IMPLANT

## 2013-04-11 NOTE — Transfer of Care (Signed)
Immediate Anesthesia Transfer of Care Note  Patient: Felicia Cooper  Procedure(s) Performed: Procedure(s): CARPAL TUNNEL RELEASE (Left)  Patient Location: PACU  Anesthesia Type:General  Level of Consciousness: sedated  Airway & Oxygen Therapy: Patient Spontanous Breathing and Patient connected to face mask oxygen  Post-op Assessment: Report given to PACU RN and Post -op Vital signs reviewed and stable  Post vital signs: Reviewed and stable  Complications: No apparent anesthesia complications

## 2013-04-11 NOTE — Anesthesia Procedure Notes (Signed)
Procedure Name: LMA Insertion Performed by: Meyer Russel Pre-anesthesia Checklist: Patient identified, Emergency Drugs available, Suction available and Patient being monitored Patient Re-evaluated:Patient Re-evaluated prior to inductionOxygen Delivery Method: Circle System Utilized Preoxygenation: Pre-oxygenation with 100% oxygen Intubation Type: IV induction Ventilation: Mask ventilation without difficulty LMA: LMA inserted LMA Size: 3.0 Number of attempts: 1 Airway Equipment and Method: bite block Placement Confirmation: positive ETCO2 and breath sounds checked- equal and bilateral Tube secured with: Tape Dental Injury: Teeth and Oropharynx as per pre-operative assessment

## 2013-04-11 NOTE — Op Note (Signed)
[id number]  

## 2013-04-11 NOTE — Brief Op Note (Signed)
04/11/2013  11:37 AM  PATIENT:  Felicia Cooper  62 y.o. female  PRE-OPERATIVE DIAGNOSIS:  LEFT CARPAL TUNNEL SYNDROME  POST-OPERATIVE DIAGNOSIS:  LEFT CARPAL TUNNEL SYNDROME  PROCEDURE:  Procedure(s): CARPAL TUNNEL RELEASE (Left)  SURGEON:  Surgeon(s) and Role:    * Wyn Forster., MD - Primary  PHYSICIAN ASSISTANT:   ASSISTANTS: Surgical technician  ANESTHESIA:   general  EBL:  Total I/O In: 1000 [I.V.:1000] Out: -   BLOOD ADMINISTERED:none  DRAINS: none   LOCAL MEDICATIONS USED:  XYLOCAINE   SPECIMEN:  No Specimen  DISPOSITION OF SPECIMEN:  N/A  COUNTS:  YES  TOURNIQUET:   Total Tourniquet Time Documented: Upper Arm (Left) - 7 minutes Total: Upper Arm (Left) - 7 minutes   DICTATION: .Other Dictation: Dictation Number [id number]  PLAN OF CARE: Discharge to home after PACU  PATIENT DISPOSITION:  PACU - hemodynamically stable.   Delay start of Pharmacological VTE agent (>24hrs) due to surgical blood loss or risk of bleeding: not applicable

## 2013-04-11 NOTE — Anesthesia Preprocedure Evaluation (Signed)
Anesthesia Evaluation  Patient identified by MRN, date of birth, ID band Patient awake    Reviewed: Allergy & Precautions, H&P , NPO status , Patient's Chart, lab work & pertinent test results  Airway Mallampati: I TM Distance: >3 FB Neck ROM: Full    Dental  (+) Teeth Intact and Dental Advisory Given   Pulmonary  breath sounds clear to auscultation        Cardiovascular Rhythm:Regular Rate:Normal     Neuro/Psych    GI/Hepatic   Endo/Other  Morbid obesity  Renal/GU      Musculoskeletal   Abdominal   Peds  Hematology   Anesthesia Other Findings   Reproductive/Obstetrics                           Anesthesia Physical Anesthesia Plan  ASA: I  Anesthesia Plan: General   Post-op Pain Management:    Induction: Intravenous  Airway Management Planned: LMA  Additional Equipment:   Intra-op Plan:   Post-operative Plan: Extubation in OR  Informed Consent: I have reviewed the patients History and Physical, chart, labs and discussed the procedure including the risks, benefits and alternatives for the proposed anesthesia with the patient or authorized representative who has indicated his/her understanding and acceptance.   Dental advisory given  Plan Discussed with: CRNA, Anesthesiologist and Surgeon  Anesthesia Plan Comments:         Anesthesia Quick Evaluation

## 2013-04-11 NOTE — Anesthesia Postprocedure Evaluation (Signed)
  Anesthesia Post-op Note  Patient: Felicia Cooper  Procedure(s) Performed: Procedure(s): CARPAL TUNNEL RELEASE (Left)  Patient Location: PACU  Anesthesia Type:General  Level of Consciousness: awake  Airway and Oxygen Therapy: Patient Spontanous Breathing  Post-op Pain: mild  Post-op Assessment: Post-op Vital signs reviewed  Post-op Vital Signs: Reviewed  Complications: No apparent anesthesia complications

## 2013-04-11 NOTE — H&P (Signed)
Felicia Cooper is an 62 y.o. female.   Chief Complaint: Left-hand numbness. HPI: 62 year old right-hand dominant woman with a history of chronic left hand numbness in the median innervated fingers. She has not responded to splinting activity modification and anti-inflammatory medication. Electrodiagnostic studies have confirmed carpal tunnel syndrome. We have recommended proceeding with the release of the transverse carpal ligament at the left wrist.  Past Medical History  Diagnosis Date  . Fibromyalgia   . Arthritis   . Depression   . Anxiety   . Hypothyroidism   . Wears glasses     Past Surgical History  Procedure Laterality Date  . Abdominal hysterectomy  2006  . Tonsillectomy    . Colonoscopy    . Total knee arthroplasty  2009    left  . Total knee arthroplasty  2009    right     History reviewed. No pertinent family history. Social History:  reports that she quit smoking about 40 years ago. She does not have any smokeless tobacco history on file. She reports that  drinks alcohol. She reports that she does not use illicit drugs.  Allergies: No Known Allergies  Medications Prior to Admission  Medication Sig Dispense Refill  . buPROPion (WELLBUTRIN SR) 150 MG 12 hr tablet Take 150 mg by mouth 2 (two) times daily.      . cholecalciferol (VITAMIN D) 1000 UNITS tablet Take 1,000 Units by mouth daily. Takes 2000      . cyclobenzaprine (FLEXERIL) 10 MG tablet Take 10 mg by mouth 3 (three) times daily as needed for muscle spasms.      Marland Kitchen gabapentin (NEURONTIN) 600 MG tablet Take 600 mg by mouth at bedtime.      Marland Kitchen levothyroxine (SYNTHROID, LEVOTHROID) 75 MCG tablet Take 75 mcg by mouth daily before breakfast.      . PARoxetine (PAXIL-CR) 25 MG 24 hr tablet Take 25 mg by mouth every morning.        No results found for this or any previous visit (from the past 48 hour(s)). No results found.  Review of Systems  Constitutional: Negative.   HENT: Negative.   Eyes: Negative.    Respiratory: Negative.   Cardiovascular: Negative.   Gastrointestinal: Negative.   Genitourinary: Negative.   Musculoskeletal: Negative.        Fibromyalgia, chronic osteoarthritis status post total knee replacement.  Skin: Negative.   Neurological: Negative.        Electrodiagnostic studies completed 04/03/2013 revealed moderately severe left carpal tunnel syndrome with a markedly diminished sensory amplitude.  Endo/Heme/Allergies: Negative.   Psychiatric/Behavioral: The patient is nervous/anxious.     Blood pressure 132/82, pulse 83, temperature 98.7 F (37.1 C), temperature source Oral, resp. rate 20, height 5' (1.524 m), weight 87.816 kg (193 lb 9.6 oz), SpO2 96.00%. Physical Exam  Constitutional: She is oriented to person, place, and time. She appears well-developed and well-nourished.  HENT:  Head: Normocephalic and atraumatic.  Eyes: Conjunctivae and EOM are normal. Pupils are equal, round, and reactive to light.  Neck: Neck supple.  Cardiovascular: Normal rate and regular rhythm.   Respiratory: Effort normal and breath sounds normal.  GI: Soft. Bowel sounds are normal.  Musculoskeletal: Normal range of motion.  Diminished sensibility median distribution left hand. Positive Tinel's sign. Positive wrist flexion test.  Neurological: She is alert and oriented to person, place, and time.  Skin: Skin is warm and dry.  Psychiatric: She has a normal mood and affect. Her behavior is normal. Judgment and thought  content normal.     Assessment/Plan Left carpal tunnel syndrome.  Release of left transverse carpal ligament under general anesthesia. The surgery, aftercare, risks and benefits were explained in detail. Questions were invited and answered.  Kamareon Sciandra JR,Marcella Charlson V 04/11/2013, 10:37 AM

## 2013-04-12 ENCOUNTER — Encounter (HOSPITAL_BASED_OUTPATIENT_CLINIC_OR_DEPARTMENT_OTHER): Payer: Self-pay | Admitting: Orthopedic Surgery

## 2013-04-12 NOTE — Op Note (Signed)
NAME:  Felicia Cooper, Felicia Cooper NO.:  [id number]  MEDICAL RECORD NO.:  [id number]  LOCATION:                               FACILITY:  [facility]  PHYSICIAN:  Katy Fitch. Jlynn Langille, M.D. DATE OF BIRTH:  1951/02/02  DATE OF PROCEDURE:  04/11/2013 DATE OF DISCHARGE:  04/11/2013                              OPERATIVE REPORT   PREOPERATIVE DIAGNOSIS:  Entrapment neuropathy, left median nerve at carpal tunnel.  POSTOPERATIVE DIAGNOSIS:  Entrapment neuropathy, left median nerve at carpal tunnel.  OPERATION:  Release of left transverse carpal ligament.  OPERATING SURGEON:  Katy Fitch. Square Jowett, MD.  ASSISTANT:  Surgical technician.  ANESTHESIA:  General by LMA.  SUPERVISING ANESTHESIOLOGIST:  Felicia Silvan, MD.  INDICATION:  Felicia Cooper is a 62 year old woman well acquainted with our practice.  She had a prior right carpal tunnel release with an excellent long-term result.  She presents for evaluation and management of numbness of the left hand.  Electrodiagnostic studies completed by Dr. Johna Cooper revealed moderately severe left carpal tunnel syndrome.  Due to a failure to respond to nonoperative measures including splinting activity modification, and anti-inflammatories, she was advised to proceed with release of her left transverse carpal ligament at this time.  Questions regarding the anticipated surgery were invited and answered in detail.  PROCEDURE:  Felicia Cooper was brought to room 2 of the [facility] and placed in supine position on the operating table.  Following the induction of general anesthesia by LMA technique, the left hand and arm were prepped with Betadine soap and solution and sterilely draped.  A pneumatic tourniquet was applied to the proximal left brachium.  Following exsanguination of the left arm with Esmarch bandage, the arterial tourniquet was inflated to 220 mmHg.  Procedure commenced with a short incision in the line of the ring  finger and the palm.  Subcutaneous tissues were carefully divided revealing the palmar fascia.  This was split longitudinally to reveal the common sensory branch of the median nerve.  The common sensory branches were followed back to the median nerve proper which was separated from the transverse carpal ligament with a Insurance risk surveyor.  The transverse carpal ligament  then released with scissors along its ulnar border extending into the distal forearm.  The volar forearm fascia was likewise released.  Bleeding points along the margin of the released ligament were electrocauterized with bipolar current followed by repair of the skin with intradermal 3-0 Prolene suture.  A compressive dressing was applied.  A sterile gauze, sterile Webril, and a volar plaster splint maintaining the wrist at 15 degrees of dorsiflexion.  Note for aftercare, she is provided prescription for Percocet 5 mg 1 p.o. q.4-6 hours p.r.n. pain, 20 tablets without refill.     Katy Fitch Sebron Mcmahill, M.D.     RVS/MEDQ  D:  04/11/2013  T:  04/11/2013  Job:  [id number]

## 2013-05-30 ENCOUNTER — Encounter: Payer: Self-pay | Admitting: *Deleted

## 2013-05-30 DIAGNOSIS — M2040 Other hammer toe(s) (acquired), unspecified foot: Secondary | ICD-10-CM | POA: Insufficient documentation

## 2013-06-04 ENCOUNTER — Encounter: Payer: Self-pay | Admitting: Podiatry

## 2013-06-04 DIAGNOSIS — M2040 Other hammer toe(s) (acquired), unspecified foot: Secondary | ICD-10-CM

## 2013-06-04 DIAGNOSIS — M21619 Bunion of unspecified foot: Secondary | ICD-10-CM

## 2013-06-05 ENCOUNTER — Telehealth: Payer: Self-pay | Admitting: *Deleted

## 2013-06-05 MED ORDER — TRAMADOL HCL 50 MG PO TABS: 50.0000 mg | ORAL_TABLET | Freq: Four times a day (QID) | ORAL | Status: DC | PRN

## 2013-06-05 NOTE — Telephone Encounter (Signed)
Pt states that the OxyContin from a previous surgery has caused severe itching.   I ordered pt to stop the OxyContin, begin Benadryl and to take Advil 200 mg 2 tablets qid in between the new pain medication, I would request.  Dr Ralene Cork ordered Tramadol 50 mg #40 one tablet q 6 hours no refills.  I informed pt Tramadol would need to be picked up at the [place] office, she states her son will pick-up today.

## 2013-06-06 ENCOUNTER — Telehealth: Payer: Self-pay | Admitting: *Deleted

## 2013-06-06 NOTE — Telephone Encounter (Signed)
Pt complains of continued pain on one Tramadol, asked if can take 2.  1118am Return call [phone] pt states having some sharp pain, not taking Advil 2 tablets po qid.  I istructed pt to take Advil 2 po qid in between doses of Tramadol, remove ace wrap only, elevate foot for 15 mins, then rewrap looser.  Pt agreed.  Pt denies chest pain or tightness, denies calf pain.

## 2013-06-10 ENCOUNTER — Encounter: Payer: Self-pay | Admitting: Podiatry

## 2013-06-10 ENCOUNTER — Ambulatory Visit (INDEPENDENT_AMBULATORY_CARE_PROVIDER_SITE_OTHER): Payer: BC Managed Care – PPO | Admitting: Podiatry

## 2013-06-10 ENCOUNTER — Ambulatory Visit (INDEPENDENT_AMBULATORY_CARE_PROVIDER_SITE_OTHER): Payer: BC Managed Care – PPO

## 2013-06-10 VITALS — BP 127/83 | HR 85 | Resp 16

## 2013-06-10 DIAGNOSIS — Z9889 Other specified postprocedural states: Secondary | ICD-10-CM

## 2013-06-10 DIAGNOSIS — M2040 Other hammer toe(s) (acquired), unspecified foot: Secondary | ICD-10-CM

## 2013-06-10 NOTE — Progress Notes (Signed)
Subjective:     Patient ID: Felicia Cooper, female   DOB: 1950/12/18, 62 y.o.   MRN: [id number]  HPI patient states I am doing well not having very much discomfort. 6 days after having multiple foot surgery left   Review of Systems  All other systems reviewed and are negative.       Objective:   Physical Exam  Nursing note and vitals reviewed. Cardiovascular: Intact distal pulses.   Musculoskeletal: Normal range of motion.  Skin: Skin is warm.   Negative Homans sign noted left. Toes are in good alignment with the second toe being moderately deviated towards the big toe with stitches intact and wound edges well coapted. Area around the fifth metatarsal healing well with mild edema    Assessment:     Good healing occurring one week post surgery left foot    Plan:     X-rays reviewed with the patient and importance of elevation and continued surgical shoe and boot usage and compression discussed sterile dressing reapplied keeping the second toe in a laterally positioned dressing

## 2013-06-13 NOTE — Addendum Note (Signed)
Addendum created 06/13/13 1855 by Judie Petit, MD   Modules edited: Anesthesia Responsible Staff

## 2013-06-20 ENCOUNTER — Encounter: Payer: Self-pay | Admitting: Podiatry

## 2013-06-20 ENCOUNTER — Ambulatory Visit: Payer: BC Managed Care – PPO | Admitting: Podiatry

## 2013-06-20 VITALS — BP 120/74 | HR 84 | Resp 12 | Ht 60.0 in | Wt 185.0 lb

## 2013-06-20 DIAGNOSIS — M2040 Other hammer toe(s) (acquired), unspecified foot: Secondary | ICD-10-CM

## 2013-06-20 NOTE — Patient Instructions (Signed)
Continue compression and splint. May begin getting the foot wet

## 2013-06-20 NOTE — Progress Notes (Signed)
Subjective:     Patient ID: Felicia Cooper, female   DOB: 10-12-1950, 62 y.o.   MRN: [id number]  HPI patient points that my foot is doing well and that the swelling seems to be going down. 2 weeks after having foot surgery left   Review of Systems     Objective:   Physical Exam  Nursing note and vitals reviewed. Constitutional: She is oriented to person, place, and time.  Cardiovascular: Intact distal pulses.   Musculoskeletal: Normal range of motion.  Neurological: She is oriented to person, place, and time.   incision sites healing left with stitches intact and toes in good alignment     Assessment:     Stitches are removed and instructions on beginning to get the foot wet given the patient    Plan:     Advised this patient on digital splint which was given to patient with instructions on usage

## 2013-07-11 ENCOUNTER — Ambulatory Visit (INDEPENDENT_AMBULATORY_CARE_PROVIDER_SITE_OTHER): Payer: BC Managed Care – PPO | Admitting: Podiatry

## 2013-07-11 ENCOUNTER — Ambulatory Visit (INDEPENDENT_AMBULATORY_CARE_PROVIDER_SITE_OTHER): Payer: BC Managed Care – PPO

## 2013-07-11 ENCOUNTER — Encounter: Payer: Self-pay | Admitting: Podiatry

## 2013-07-11 VITALS — BP 131/76 | HR 77 | Resp 12

## 2013-07-11 DIAGNOSIS — Z9889 Other specified postprocedural states: Secondary | ICD-10-CM

## 2013-07-11 DIAGNOSIS — R609 Edema, unspecified: Secondary | ICD-10-CM

## 2013-07-11 DIAGNOSIS — M2040 Other hammer toe(s) (acquired), unspecified foot: Secondary | ICD-10-CM

## 2013-07-11 NOTE — Progress Notes (Signed)
Subjective:     Patient ID: Felicia Cooper, female   DOB: [birth year], 62 y.o.   MRN: [id number]  HPI patient states doing fine with the foot it is mildly swollen but is not hurting me. 5 weeks after foot surgery left   Review of Systems     Objective:   Physical Exam Neurovascular status intact pins intact second and third toes with good alignment of the underlying structure of the digits noted. Patient's incision site to be healing well with mild edema no erythema drainage noted    Assessment:     Well-healing surgical sites left with pins intact    Plan:     Pins are removed and the second and third toe and sterile dressings applied and x-rays reviewed. Dispensed anklet with instructions of gradual return saw shoe gear in the next several weeks

## 2013-09-02 NOTE — Progress Notes (Signed)
1) Metatarsal osteotomy 5th met left foot 2) Hammer toe repair 2nd, 3rd, and 4th toes left foot

## 2013-09-11 ENCOUNTER — Ambulatory Visit (INDEPENDENT_AMBULATORY_CARE_PROVIDER_SITE_OTHER): Payer: BC Managed Care – PPO

## 2013-09-11 ENCOUNTER — Ambulatory Visit (INDEPENDENT_AMBULATORY_CARE_PROVIDER_SITE_OTHER): Payer: BC Managed Care – PPO | Admitting: Podiatry

## 2013-09-11 ENCOUNTER — Encounter: Payer: Self-pay | Admitting: Podiatry

## 2013-09-11 VITALS — BP 135/77 | HR 96 | Resp 16

## 2013-09-11 DIAGNOSIS — Z9889 Other specified postprocedural states: Secondary | ICD-10-CM

## 2013-09-11 DIAGNOSIS — M21619 Bunion of unspecified foot: Secondary | ICD-10-CM

## 2013-09-11 NOTE — Progress Notes (Signed)
Subjective:     Patient ID: Felicia Cooper, female   DOB: 11-Jun-1951, 63 y.o.   MRN: [id number]  HPI patient presents stating my toes are doing pretty well but they are still slightly against the big toe but I am pleased with how I'm doing   Review of Systems     Objective:   Physical Exam Neurovascular intact with well-healed surgical sites left foot 3 months postop    Assessment:     Progressing well with mild medial rotation of the second toe but no sagittal plane deformity    Plan:     Advised on bandaging the toe and patient may return to normal activity and is discharged and less needed

## 2014-08-13 ENCOUNTER — Ambulatory Visit (INDEPENDENT_AMBULATORY_CARE_PROVIDER_SITE_OTHER): Payer: BC Managed Care – PPO | Admitting: Podiatry

## 2014-08-13 ENCOUNTER — Ambulatory Visit (INDEPENDENT_AMBULATORY_CARE_PROVIDER_SITE_OTHER): Payer: BC Managed Care – PPO

## 2014-08-13 ENCOUNTER — Encounter: Payer: Self-pay | Admitting: Podiatry

## 2014-08-13 VITALS — BP 135/86 | HR 72 | Resp 13 | Ht 60.0 in | Wt 195.0 lb

## 2014-08-13 DIAGNOSIS — M79672 Pain in left foot: Secondary | ICD-10-CM

## 2014-08-13 DIAGNOSIS — M7662 Achilles tendinitis, left leg: Secondary | ICD-10-CM

## 2014-08-13 MED ORDER — TRIAMCINOLONE ACETONIDE 10 MG/ML IJ SUSP
10.0000 mg | Freq: Once | INTRAMUSCULAR | Status: AC
  Administered 2014-08-13: 10 mg

## 2014-08-13 NOTE — Progress Notes (Signed)
Subjective:     Patient ID: Felicia Cooper, female   DOB: Apr 07, 1951, 63 y.o.   MRN: [id number]  HPI patient presents stating I'm getting a lot of pain in the back of my left heel and I don't remember a specific injury but I did wear flat sandals this summer   Review of Systems     Objective:   Physical Exam Neurovascular status intact muscle strength was adequate with inflammation in the posterior aspect left heel medial side with inflammation and fluid buildup and no indications of tendon dysfunction or issues at the muscle tendon junction    Assessment:     Achilles tendinitis right medial side    Plan:     H&P and x-ray reviewed and careful injection medial side after explaining to her chances for rupture associated with it was administered consisting of 3 mg dexamethasone Kenalog 5 mg Xylocaine keeping it away from the central lateral portion of the tendon. She has air fracture walker which she will initiate at this time and she will be checked back again in 2 weeks earlier if any issues should occur

## 2014-08-13 NOTE — Patient Instructions (Signed)

## 2014-08-13 NOTE — Progress Notes (Signed)
   Subjective:    Patient ID: Felicia Cooper, female    DOB: 1950/09/07, 63 y.o.   MRN: [id number]  HPI Comments: Pt states she feels this began this summer, while wearing flat sandals.   Pt denies any treatments  Foot Pain      Review of Systems  All other systems reviewed and are negative.      Objective:   Physical Exam        Assessment & Plan:

## 2014-08-27 ENCOUNTER — Ambulatory Visit (INDEPENDENT_AMBULATORY_CARE_PROVIDER_SITE_OTHER): Payer: BLUE CROSS/BLUE SHIELD | Admitting: Podiatry

## 2014-08-27 VITALS — BP 110/65 | HR 95 | Resp 16

## 2014-08-27 DIAGNOSIS — M7662 Achilles tendinitis, left leg: Secondary | ICD-10-CM

## 2014-08-27 NOTE — Progress Notes (Signed)
Subjective:     Patient ID: Felicia Cooper, female   DOB: 17-Mar-1951, 64 y.o.   MRN: [id number]  HPI patient states it's feeling quite a bit better and I'm having reduced discomfort with walking   Review of Systems     Objective:   Physical Exam Neurovascular status unchanged with diminishment of discomfort posterior aspect left heel with fluid buildup still noted but minimal and patient still wearing boot full-time    Assessment:     Patient's doing much better after being treated for Achilles tendinitis left    Plan:     Advised on physical therapy and at this time recommended gradual reduction of the boot over the next 2 weeks. Reappoint for me to recheck

## 2014-11-26 ENCOUNTER — Other Ambulatory Visit: Payer: Self-pay | Admitting: Family Medicine

## 2014-11-26 DIAGNOSIS — H532 Diplopia: Secondary | ICD-10-CM

## 2014-11-27 ENCOUNTER — Other Ambulatory Visit: Payer: Self-pay | Admitting: Family Medicine

## 2014-11-27 DIAGNOSIS — H532 Diplopia: Secondary | ICD-10-CM

## 2014-12-01 ENCOUNTER — Ambulatory Visit
Admission: RE | Admit: 2014-12-01 | Discharge: 2014-12-01 | Disposition: A | Discharge status: Home / Self Care | Payer: BLUE CROSS/BLUE SHIELD | Source: Ambulatory Visit | Attending: Family Medicine | Admitting: Family Medicine

## 2014-12-01 DIAGNOSIS — H532 Diplopia: Secondary | ICD-10-CM

## 2014-12-19 ENCOUNTER — Ambulatory Visit
Admission: RE | Admit: 2014-12-19 | Discharge: 2014-12-19 | Disposition: A | Discharge status: Home / Self Care | Payer: BLUE CROSS/BLUE SHIELD | Source: Ambulatory Visit | Attending: Family Medicine | Admitting: Family Medicine

## 2014-12-19 DIAGNOSIS — H532 Diplopia: Secondary | ICD-10-CM

## 2015-02-16 ENCOUNTER — Ambulatory Visit (INDEPENDENT_AMBULATORY_CARE_PROVIDER_SITE_OTHER): Payer: BLUE CROSS/BLUE SHIELD | Admitting: Podiatry

## 2015-02-16 DIAGNOSIS — M7662 Achilles tendinitis, left leg: Secondary | ICD-10-CM | POA: Diagnosis not present

## 2015-02-16 MED ORDER — TRIAMCINOLONE ACETONIDE 10 MG/ML IJ SUSP
10.0000 mg | Freq: Once | INTRAMUSCULAR | Status: AC
  Administered 2015-02-16: 10 mg

## 2015-02-18 NOTE — Progress Notes (Signed)
Subjective:     Patient ID: Felicia Cooper, female   DOB: 1950/08/25, 64 y.o.   MRN: [id number]  HPI patient presents stating that there is pain in the Achilles tendon on the medial side that at times makes it hard to wear shoe gear comfortably. States it's been more recent induration   Review of Systems     Objective:   Physical Exam Neurovascular status is intact muscle strength adequate range of motion within normal limits. Patient's noted to have quite a bit of discomfort in the medial Achilles tendon at the insertion to the calcaneus with fluid buildup with no indication of central or lateral tendon involvement    Assessment:     Achilles tendinitis medial side left    Plan:     H&P condition reviewed with patient x-rays reviewed. Today I advised on careful injection after explaining chances for rupture and patient willing to accept risk and I digital medial injection 3 mg [place] some Kenalog 5 mill grams Xylocaine and advised on complete immobilization ice therapy and reduced activity. Reappoint to recheck if symptoms persist

## 2015-07-02 ENCOUNTER — Encounter: Payer: Self-pay | Admitting: Podiatry

## 2015-07-02 ENCOUNTER — Ambulatory Visit (INDEPENDENT_AMBULATORY_CARE_PROVIDER_SITE_OTHER): Payer: BLUE CROSS/BLUE SHIELD | Admitting: Podiatry

## 2015-07-02 VITALS — BP 116/67 | HR 74 | Resp 16

## 2015-07-02 DIAGNOSIS — M7662 Achilles tendinitis, left leg: Secondary | ICD-10-CM | POA: Diagnosis not present

## 2015-07-02 MED ORDER — TRIAMCINOLONE ACETONIDE 10 MG/ML IJ SUSP
10.0000 mg | Freq: Once | INTRAMUSCULAR | Status: AC
  Administered 2015-07-02: 10 mg

## 2015-07-02 NOTE — Patient Instructions (Signed)

## 2015-07-03 NOTE — Progress Notes (Signed)
Subjective:     Patient ID: Felicia Cooper, female   DOB: [birth year], 64 y.o.   MRN: [id number]  HPI patient states I was doing very well for a number of months and it started to develop discomfort again at this time   Review of Systems     Objective:   Physical Exam Neurovascular status intact muscle strength adequate range of motion within normal limits with patient noted to have inflammation and pain in the medial side of the left Achilles tendon localized in nature with no current discomfort central or lateral    Assessment:     Achilles tendinitis left present    Plan:     H&P condition reviewed with patient and did careful medial injection 3 mg Kenalog dexamethasone 5 mg Xylocaine after first discussing with patient and chances for rupture. If symptoms persist or we see a quicker reoccurrence were in the need to consider shockwave therapy

## 2015-11-24 ENCOUNTER — Other Ambulatory Visit: Payer: Self-pay | Admitting: Gastroenterology

## 2016-07-22 ENCOUNTER — Other Ambulatory Visit: Payer: Self-pay | Admitting: Physician Assistant

## 2018-06-06 ENCOUNTER — Other Ambulatory Visit: Payer: Self-pay | Admitting: Obstetrics & Gynecology

## 2018-06-06 DIAGNOSIS — R928 Other abnormal and inconclusive findings on diagnostic imaging of breast: Secondary | ICD-10-CM

## 2018-06-12 ENCOUNTER — Ambulatory Visit
Admission: RE | Admit: 2018-06-12 | Discharge: 2018-06-12 | Disposition: A | Discharge status: Home / Self Care | Payer: BLUE CROSS/BLUE SHIELD | Source: Ambulatory Visit | Attending: Obstetrics & Gynecology | Admitting: Obstetrics & Gynecology

## 2018-06-12 ENCOUNTER — Ambulatory Visit: Payer: BLUE CROSS/BLUE SHIELD

## 2018-06-12 DIAGNOSIS — R928 Other abnormal and inconclusive findings on diagnostic imaging of breast: Secondary | ICD-10-CM

## 2018-07-26 ENCOUNTER — Other Ambulatory Visit: Payer: Self-pay | Admitting: Physician Assistant

## 2019-06-20 ENCOUNTER — Other Ambulatory Visit: Payer: Self-pay | Admitting: Physician Assistant

## 2020-04-29 ENCOUNTER — Other Ambulatory Visit (HOSPITAL_COMMUNITY): Payer: Self-pay | Admitting: Orthopedic Surgery

## 2020-04-29 ENCOUNTER — Other Ambulatory Visit: Payer: Self-pay | Admitting: Orthopedic Surgery

## 2020-04-29 DIAGNOSIS — Z96653 Presence of artificial knee joint, bilateral: Secondary | ICD-10-CM

## 2020-05-07 ENCOUNTER — Ambulatory Visit (HOSPITAL_COMMUNITY)
Admission: RE | Admit: 2020-05-07 | Discharge: 2020-05-07 | Disposition: A | Discharge status: Home / Self Care | Payer: Medicare Other | Source: Ambulatory Visit | Attending: Orthopedic Surgery | Admitting: Orthopedic Surgery

## 2020-05-07 ENCOUNTER — Encounter (HOSPITAL_COMMUNITY)
Admission: RE | Admit: 2020-05-07 | Discharge: 2020-05-07 | Disposition: A | Discharge status: Home / Self Care | Payer: Medicare Other | Source: Ambulatory Visit | Attending: Orthopedic Surgery | Admitting: Orthopedic Surgery

## 2020-05-07 ENCOUNTER — Other Ambulatory Visit: Payer: Self-pay

## 2020-05-07 DIAGNOSIS — Z96653 Presence of artificial knee joint, bilateral: Secondary | ICD-10-CM

## 2020-05-07 MED ORDER — TECHNETIUM TC 99M MEDRONATE IV KIT
20.0000 | PACK | Freq: Once | INTRAVENOUS | Status: AC | PRN
  Administered 2020-05-07: 20 via INTRAVENOUS

## 2020-05-21 DIAGNOSIS — T849XXA Unspecified complication of internal orthopedic prosthetic device, implant and graft, initial encounter: Secondary | ICD-10-CM | POA: Insufficient documentation

## 2020-12-23 ENCOUNTER — Ambulatory Visit (INDEPENDENT_AMBULATORY_CARE_PROVIDER_SITE_OTHER): Payer: Medicare Other | Admitting: Physician Assistant

## 2020-12-23 ENCOUNTER — Encounter: Payer: Self-pay | Admitting: Physician Assistant

## 2020-12-23 ENCOUNTER — Other Ambulatory Visit: Payer: Self-pay

## 2020-12-23 DIAGNOSIS — D485 Neoplasm of uncertain behavior of skin: Secondary | ICD-10-CM | POA: Diagnosis not present

## 2020-12-23 DIAGNOSIS — Z85828 Personal history of other malignant neoplasm of skin: Secondary | ICD-10-CM | POA: Diagnosis not present

## 2020-12-23 DIAGNOSIS — D489 Neoplasm of uncertain behavior, unspecified: Secondary | ICD-10-CM

## 2020-12-23 DIAGNOSIS — Z1283 Encounter for screening for malignant neoplasm of skin: Secondary | ICD-10-CM | POA: Diagnosis not present

## 2020-12-23 NOTE — Patient Instructions (Signed)

## 2021-01-01 ENCOUNTER — Encounter: Payer: Self-pay | Admitting: Physician Assistant

## 2021-01-01 NOTE — Progress Notes (Signed)
Follow-Up Visit   Subjective  Felicia Cooper is a 70 y.o. female who presents for the following: Skin Problem (New lesion on right forearm x 3 months- no itch no bleed- lesion is tender. Personal history of non mole skin cancer.).   The following portions of the chart were reviewed this encounter and updated as appropriate:  Tobacco  Allergies  Meds  Problems  Med Hx  Surg Hx  Fam Hx      Objective  Well appearing patient in no apparent distress; mood and affect are within normal limits.  All skin waist up examined.  Left Temporal Scalp       Left Preauricular Area       Under Right Eye       Right Forearm - Posterior        Assessment & Plan  Neoplasm of uncertain behavior of skin (3) Left Temporal Scalp  Skin / nail biopsy Type of biopsy: tangential   Informed consent: discussed and consent obtained   Timeout: patient name, date of birth, surgical site, and procedure verified   Procedure prep:  Patient was prepped and draped in usual sterile fashion (Non sterile) Prep type:  Chlorhexidine Anesthesia: the lesion was anesthetized in a standard fashion   Anesthetic:  1% lidocaine w/ epinephrine 1-100,000 local infiltration Instrument used: flexible razor blade   Hemostasis achieved with: aluminum chloride   Outcome: patient tolerated procedure well   Post-procedure details: sterile dressing applied and wound care instructions given   Dressing type: bandage and petrolatum    Specimen 2 - Surgical pathology Differential Diagnosis: R/O BCC vs SCC  Check Margins: No  Left Preauricular Area  Skin / nail biopsy Type of biopsy: tangential   Informed consent: discussed and consent obtained   Timeout: patient name, date of birth, surgical site, and procedure verified   Procedure prep:  Patient was prepped and draped in usual sterile fashion (Non sterile) Prep type:  Chlorhexidine Anesthesia: the lesion was anesthetized in a standard fashion    Anesthetic:  1% lidocaine w/ epinephrine 1-100,000 local infiltration Instrument used: flexible razor blade   Hemostasis achieved with: aluminum chloride   Outcome: patient tolerated procedure well   Post-procedure details: sterile dressing applied and wound care instructions given   Dressing type: bandage and petrolatum    Specimen 3 - Surgical pathology Differential Diagnosis: R/O ISK  Check Margins: No  Under Right Eye  Skin / nail biopsy Type of biopsy: tangential   Informed consent: discussed and consent obtained   Timeout: patient name, date of birth, surgical site, and procedure verified   Procedure prep:  Patient was prepped and draped in usual sterile fashion (Non sterile) Prep type:  Chlorhexidine Anesthesia: the lesion was anesthetized in a standard fashion   Anesthetic:  1% lidocaine w/ epinephrine 1-100,000 local infiltration Instrument used: flexible razor blade   Hemostasis achieved with: aluminum chloride   Outcome: patient tolerated procedure well   Post-procedure details: sterile dressing applied and wound care instructions given   Dressing type: petrolatum    Specimen 4 - Surgical pathology Differential Diagnosis: R/O Tag  Check Margins: No  Neoplasm of uncertain behavior Right Forearm - Posterior  Skin / nail biopsy Type of biopsy: tangential   Informed consent: discussed and consent obtained   Timeout: patient name, date of birth, surgical site, and procedure verified   Procedure prep:  Patient was prepped and draped in usual sterile fashion (Non sterile) Prep type:  Chlorhexidine Anesthesia: the lesion  was anesthetized in a standard fashion   Anesthetic:  1% lidocaine w/ epinephrine 1-100,000 local infiltration Instrument used: flexible razor blade   Hemostasis achieved with: aluminum chloride   Outcome: patient tolerated procedure well   Post-procedure details: sterile dressing applied and wound care instructions given   Dressing type: bandage  and petrolatum    Specimen 1 - Surgical pathology Differential Diagnosis: R/O SCC vs KA  Check Margins: No   I, Rynn Markiewicz, PA-C, have reviewed all documentation's for this visit.  The documentation on 01/01/21 for the exam, diagnosis, procedures and orders are all accurate and complete.

## 2021-01-04 ENCOUNTER — Telehealth: Payer: Self-pay

## 2021-01-04 NOTE — Telephone Encounter (Signed)
Patient left message on office voice mail that she was returning call about results.

## 2021-01-04 NOTE — H&P (Signed)
TOTAL KNEE REVISION ADMISSION H&P  Patient is being admitted for left revision total knee arthroplasty.  Subjective:  Chief Complaint:left knee pain.  HPI: Felicia Cooper, 70 y.o. female, has a history of pain and functional disability in the left knee(s) due to ligamentous instability, secondary to LCL tear and patient has failed non-surgical conservative treatments for greater than 12 weeks to include supervised PT with diminished ADL's post treatment, use of assistive devices and activity modification. The indications for the revision of the total knee arthroplasty are LCL tear resulting in instability and pain . Onset of symptoms was abrupt starting in August 2021 after feeling a pop in the knee. She was evaluated in the office and MRI revealed high grade LCL tear.  Prior procedures on the right knee(s) include arthroplasty.  Patient currently rates pain in the right knee(s) at 7 out of 10 with activity. There is worsening of pain with activity and weight bearing, pain that interferes with activities of daily living, joint swelling and instability  There is no current active infection. Dr. Alvan Dame reviewed her MRI with her, as well as surgical options of attempted LCL repair vs conversion to constrained/hinged knee revision. After discussion, she is agreeable to conversion to right hinged knee revision arthroplasty.   Patient Active Problem List   Diagnosis Date Noted  . Hammer toe    Past Medical History:  Diagnosis Date  . Anxiety   . Arthritis   . Depression   . Fibromyalgia   . Hammer toe   . Hypothyroidism   . Wears glasses     Past Surgical History:  Procedure Laterality Date  . ABDOMINAL HYSTERECTOMY  2006  . CARPAL TUNNEL RELEASE Left 04/11/2013   Procedure: CARPAL TUNNEL RELEASE;  Surgeon: Cammie Sickle., MD;  Location: [place];  Service: Orthopedics;  Laterality: Left;  . COLONOSCOPY    . HAMMER TOE SURGERY    . JOINT REPLACEMENT Bilateral 2009  .  TAYLOR BUNIONECTOMY    . TONSILLECTOMY    . TOTAL KNEE ARTHROPLASTY  2009   left  . TOTAL KNEE ARTHROPLASTY  2009   right     No current facility-administered medications for this encounter.   Current Outpatient Medications  Medication Sig Dispense Refill Last Dose  . buPROPion (WELLBUTRIN SR) 150 MG 12 hr tablet Take 150 mg by mouth 2 (two) times daily.     . fexofenadine (ALLEGRA) 180 MG tablet Take 180 mg by mouth daily.     Marland Kitchen gabapentin (NEURONTIN) 300 MG capsule Take 300 mg by mouth daily.     Marland Kitchen ibuprofen (ADVIL) 200 MG tablet Take 800 mg by mouth every 8 (eight) hours as needed for moderate pain.     Marland Kitchen levothyroxine (SYNTHROID) 88 MCG tablet Take 88 mcg by mouth daily before breakfast.     . PARoxetine (PAXIL) 40 MG tablet Take 40 mg by mouth daily.     . vitamin C (ASCORBIC ACID) 250 MG tablet Take 250 mg by mouth daily.      Allergies  Allergen Reactions  . Oxycodone Itching    Social History   Tobacco Use  . Smoking status: Former Smoker    Quit date: 04/05/1973    Years since quitting: 47.7  . Smokeless tobacco: Never Used  Substance Use Topics  . Alcohol use: Yes    Comment: rare    No family history on file.    Review of Systems  Constitutional: Negative for chills and fever.  Respiratory: Negative for cough and shortness of breath.   Cardiovascular: Negative for chest pain.  Gastrointestinal: Negative for nausea and vomiting.  Musculoskeletal: Positive for arthralgias.     Objective:  Physical Exam Left knee exam: Surgical incision is healed without erythema warmth or palpable effusion She has a correctable varus left knee No mechanical symptoms are elicited with extension and flexion No lower extremity edema or calf tenderness  Vital signs in last 24 hours:    Labs:  Estimated body mass index is 38.08 kg/m as calculated from the following:   Height as of 08/13/14: 5' (1.524 m).   Weight as of 08/13/14: 88.5 kg.  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   The patient history, physical examination, clinical judgment of the provider and imaging studies are consistent with unstable left total knee arthroplasty of the left knee(s), previous total knee arthroplasty. Revision total knee arthroplasty is deemed medically necessary. The treatment options including medical management, injection therapy, arthroscopy and revision arthroplasty were discussed at length. The risks and benefits of revision total knee arthroplasty were presented and reviewed. The risks due to aseptic loosening, infection, stiffness, patella tracking problems, thromboembolic complications and other imponderables were discussed. The patient acknowledged the explanation, agreed to proceed with the plan and consent was signed. Patient is being admitted for inpatient treatment for surgery, pain control, PT, OT, prophylactic antibiotics, VTE prophylaxis, progressive ambulation and ADL's and discharge planning.The patient is planning to be discharged home.   Griffith Citron, PA-C Orthopedic Surgery [facility] [place] [phone]

## 2021-01-04 NOTE — Telephone Encounter (Signed)
-----   Message from Warren Danes, [place] sent at 01/01/2021  2:21 PM EDT ----- 30 for number one

## 2021-01-04 NOTE — Telephone Encounter (Signed)
[facility] office 

## 2021-01-04 NOTE — Patient Instructions (Signed)
DUE TO COVID-19 ONLY ONE VISITOR IS ALLOWED TO COME WITH YOU AND STAY IN THE WAITING ROOM ONLY DURING PRE OP AND PROCEDURE DAY OF SURGERY. THE 2 VISITORS  MAY VISIT WITH YOU AFTER SURGERY IN YOUR PRIVATE ROOM DURING VISITING HOURS ONLY!  YOU NEED TO HAVE A COVID 19 TEST ON___5/17____ @__9 :50_____, THIS TEST MUST BE DONE BEFORE SURGERY,  COVID TESTING SITE [place] [address], IT IS ON THE RIGHT GOING OUT [address] APPROXIMATELY  2 MINUTES PAST [employer] ON THE RIGHT. ONCE YOUR COVID TEST IS COMPLETED,  PLEASE BEGIN THE QUARANTINE INSTRUCTIONS AS OUTLINED IN YOUR HANDOUT.                Harl Favor    Your procedure is scheduled on: 01/07/21   Report to [facility] Main  Entrance   Report to admitting at  6:10 AM     Call this number if you have problems the morning of surgery [phone]   . BRUSH YOUR TEETH MORNING OF SURGERY AND RINSE YOUR MOUTH OUT, NO CHEWING GUM CANDY OR MINTS.   No food after midnight.    You may have clear liquid until 5:30 AM.    At 5:00 AM drink pre surgery drink.   Nothing by mouth after 5:30 AM  .  Take these medicines the morning of surgery with A SIP OF WATER:  Gabapentin, Wellbutrin,Paxil, Levothyroxine                                 You may not have any metal on your body including hair pins and              piercings  Do not wear jewelry, make-up, lotions, powders or perfumes, deodorant             Do not wear nail polish on your fingernails.  Do not shave  48 hours prior to surgery.     Do not bring valuables to the hospital. [place].  Contacts, dentures or bridgework may not be worn into surgery.                  Please read over the following fact sheets you were given: _____________________________________________________________________             [facility] - Preparing for Surgery Before surgery, you can play an important  role.  Because skin is not sterile, your skin needs to be as free of germs as possible.  You can reduce the number of germs on your skin by washing with CHG (chlorahexidine gluconate) soap before surgery.  CHG is an antiseptic cleaner which kills germs and bonds with the skin to continue killing germs even after washing. Please DO NOT use if you have an allergy to CHG or antibacterial soaps.  If your skin becomes reddened/irritated stop using the CHG and inform your nurse when you arrive at Short Stay. Do not shave (including legs and underarms) for at least 48 hours prior to the first CHG shower.   Please follow these instructions carefully:  1.  Shower with CHG Soap the night before surgery and the  morning of Surgery.  2.  If you choose to wash your hair, wash your hair first as usual with your  normal  shampoo.  3.  After you shampoo, rinse your hair and body thoroughly to remove the  shampoo.                                        4.  Use CHG as you would any other liquid soap.  You can apply chg directly  to the skin and wash                       Gently with a scrungie or clean washcloth.  5.  Apply the CHG Soap to your body ONLY FROM THE NECK DOWN.   Do not use on face/ open                           Wound or open sores. Avoid contact with eyes, ears mouth and genitals (private parts).                       Wash face,  Genitals (private parts) with your normal soap.             6.  Wash thoroughly, paying special attention to the area where your surgery  will be performed.  7.  Thoroughly rinse your body with warm water from the neck down.  8.  DO NOT shower/wash with your normal soap after using and rinsing off  the CHG Soap.             9.  Pat yourself dry with a clean towel.            10.  Wear clean pajamas.            11.  Place clean sheets on your bed the night of your first shower and do not  sleep with pets. Day of Surgery : Do not apply any lotions/deodorants the morning of  surgery.  Please wear clean clothes to the hospital/surgery center.  FAILURE TO FOLLOW THESE INSTRUCTIONS MAY RESULT IN THE CANCELLATION OF YOUR SURGERY PATIENT SIGNATURE_________________________________  NURSE SIGNATURE__________________________________  ________________________________________________________________________   Adam Phenix  An incentive spirometer is a tool that can help keep your lungs clear and active. This tool measures how well you are filling your lungs with each breath. Taking long deep breaths may help reverse or decrease the chance of developing breathing (pulmonary) problems (especially infection) following:  A long period of time when you are unable to move or be active. BEFORE THE PROCEDURE   If the spirometer includes an indicator to show your best effort, your nurse or respiratory therapist will set it to a desired goal.  If possible, sit up straight or lean slightly forward. Try not to slouch.  Hold the incentive spirometer in an upright position. INSTRUCTIONS FOR USE  1. Sit on the edge of your bed if possible, or sit up as far as you can in bed or on a chair. 2. Hold the incentive spirometer in an upright position. 3. Breathe out normally. 4. Place the mouthpiece in your mouth and seal your lips tightly around it. 5. Breathe in slowly and as deeply as possible, raising the piston or the ball toward the top of the column. 6. Hold your breath for 3-5 seconds or for as long as possible. Allow the piston or ball to fall to the bottom of the column. 7.  Remove the mouthpiece from your mouth and breathe out normally. 8. Rest for a few seconds and repeat Steps 1 through 7 at least 10 times every 1-2 hours when you are awake. Take your time and take a few normal breaths between deep breaths. 9. The spirometer may include an indicator to show your best effort. Use the indicator as a goal to work toward during each repetition. 10. After each set of 10  deep breaths, practice coughing to be sure your lungs are clear. If you have an incision (the cut made at the time of surgery), support your incision when coughing by placing a pillow or rolled up towels firmly against it. Once you are able to get out of bed, walk around indoors and cough well. You may stop using the incentive spirometer when instructed by your caregiver.  RISKS AND COMPLICATIONS  Take your time so you do not get dizzy or light-headed.  If you are in pain, you may need to take or ask for pain medication before doing incentive spirometry. It is harder to take a deep breath if you are having pain. AFTER USE  Rest and breathe slowly and easily.  It can be helpful to keep track of a log of your progress. Your caregiver can provide you with a simple table to help with this. If you are using the spirometer at home, follow these instructions: [place] IF:   You are having difficultly using the spirometer.  You have trouble using the spirometer as often as instructed.  Your pain medication is not giving enough relief while using the spirometer.  You develop fever of 100.5 F (38.1 C) or higher. SEEK IMMEDIATE MEDICAL CARE IF:   You cough up bloody sputum that had not been present before.  You develop fever of 102 F (38.9 C) or greater.  You develop worsening pain at or near the incision site. MAKE SURE YOU:   Understand these instructions.  Will watch your condition.  Will get help right away if you are not doing well or get worse. Document Released: 12/19/2006 Document Revised: 10/31/2011 Document Reviewed: 02/19/2007 [facility] Patient Information 2014 [place], [place].   ________________________________________________________________________

## 2021-01-05 ENCOUNTER — Encounter (HOSPITAL_COMMUNITY)
Admission: RE | Admit: 2021-01-05 | Discharge: 2021-01-05 | Disposition: A | Discharge status: Home / Self Care | Payer: Medicare Other | Source: Ambulatory Visit | Attending: Orthopedic Surgery | Admitting: Orthopedic Surgery

## 2021-01-05 ENCOUNTER — Other Ambulatory Visit (HOSPITAL_COMMUNITY)
Admission: RE | Admit: 2021-01-05 | Discharge: 2021-01-05 | Disposition: A | Discharge status: Home / Self Care | Payer: Medicare Other | Source: Ambulatory Visit | Attending: Orthopedic Surgery | Admitting: Orthopedic Surgery

## 2021-01-05 ENCOUNTER — Encounter (HOSPITAL_COMMUNITY): Payer: Self-pay

## 2021-01-05 ENCOUNTER — Other Ambulatory Visit: Payer: Self-pay

## 2021-01-05 DIAGNOSIS — Z01812 Encounter for preprocedural laboratory examination: Secondary | ICD-10-CM | POA: Insufficient documentation

## 2021-01-05 DIAGNOSIS — Z20822 Contact with and (suspected) exposure to covid-19: Secondary | ICD-10-CM | POA: Insufficient documentation

## 2021-01-05 HISTORY — DX: Gastro-esophageal reflux disease without esophagitis: K21.9

## 2021-01-05 LAB — COMPREHENSIVE METABOLIC PANEL
ALT: 23 U/L (ref 0–44)
AST: 18 U/L (ref 15–41)
Albumin: 4.3 g/dL (ref 3.5–5.0)
Alkaline Phosphatase: 73 U/L (ref 38–126)
Anion gap: 7 (ref 5–15)
BUN: 19 mg/dL (ref 8–23)
CO2: 30 mmol/L (ref 22–32)
Calcium: 11.3 mg/dL — ABNORMAL HIGH (ref 8.9–10.3)
Chloride: 102 mmol/L (ref 98–111)
Creatinine, Ser: 0.83 mg/dL (ref 0.44–1.00)
GFR, Estimated: >60 mL/min (ref >60)
Glucose, Bld: 114 mg/dL — ABNORMAL HIGH (ref 70–99)
Potassium: 4 mmol/L (ref 3.5–5.1)
Sodium: 139 mmol/L (ref 135–145)
Total Bilirubin: 0.5 mg/dL (ref 0.3–1.2)
Total Protein: 7.5 g/dL (ref 6.5–8.1)

## 2021-01-05 LAB — CBC
HCT: 45.2 % (ref 36.0–46.0)
Hemoglobin: 14.7 g/dL (ref 12.0–15.0)
MCH: 29.9 pg (ref 26.0–34.0)
MCHC: 32.5 g/dL (ref 30.0–36.0)
MCV: 92.1 fL (ref 80.0–100.0)
Platelets: 255 10*3/uL (ref 150–400)
RBC: 4.91 MIL/uL (ref 3.87–5.11)
RDW: 13 % (ref 11.5–15.5)
WBC: 8.3 10*3/uL (ref 4.0–10.5)
nRBC: 0 % (ref 0.0–0.2)

## 2021-01-05 LAB — PROTIME-INR
INR: 0.9 (ref 0.8–1.2)
Prothrombin Time: 12.3 seconds (ref 11.4–15.2)

## 2021-01-05 LAB — SURGICAL PCR SCREEN
MRSA, PCR: NEGATIVE
Staphylococcus aureus: NEGATIVE

## 2021-01-05 LAB — APTT: aPTT: 25 seconds (ref 24–36)

## 2021-01-05 NOTE — Progress Notes (Signed)
COVID Vaccine Completed:yes Date COVID Vaccine completed:11/2019 COVID vaccine manufacturer: Pfizer      PCP - Dr. Edwin Dada Cardiologist - none  Chest x-ray - no EKG - no Stress Test - no ECHO - no Cardiac Cath - no Pacemaker/ICD device last checked:NA  Sleep Study - no CPAP -   Fasting Blood Sugar - NA Checks Blood Sugar _____ times a day  Blood Thinner Instructions:NA Aspirin Instructions: Last Dose:  Anesthesia review:   Patient denies shortness of breath, fever, cough and chest pain at PAT appointment Yes. Pt's BMI is 39.8 and she gets SOB after 1 flight of stairs but not doing house work or with ADLs  Patient verbalized understanding of instructions that were given to them at the PAT appointment. Patient was also instructed that they will need to review over the PAT instructions again at home before surgery.Yes

## 2021-01-06 LAB — SARS CORONAVIRUS 2 (TAT 6-24 HRS): SARS Coronavirus 2: NEGATIVE

## 2021-01-07 ENCOUNTER — Inpatient Hospital Stay (HOSPITAL_COMMUNITY)
Admission: RE | Admit: 2021-01-07 | Discharge: 2021-01-08 | DRG: 468 | Disposition: A | Discharge status: Skilled Nursing Facility | Payer: Medicare Other | Source: Ambulatory Visit | Attending: Orthopedic Surgery | Admitting: Orthopedic Surgery

## 2021-01-07 ENCOUNTER — Encounter (HOSPITAL_COMMUNITY): Payer: Self-pay | Admitting: Orthopedic Surgery

## 2021-01-07 ENCOUNTER — Inpatient Hospital Stay (HOSPITAL_COMMUNITY): Payer: Medicare Other | Admitting: Certified Registered"

## 2021-01-07 ENCOUNTER — Other Ambulatory Visit: Payer: Self-pay

## 2021-01-07 ENCOUNTER — Encounter (HOSPITAL_COMMUNITY)
Admission: RE | Disposition: A | Discharge status: Home / Self Care | Payer: Self-pay | Source: Ambulatory Visit | Attending: Orthopedic Surgery

## 2021-01-07 DIAGNOSIS — Z885 Allergy status to narcotic agent status: Secondary | ICD-10-CM

## 2021-01-07 DIAGNOSIS — Z79899 Other long term (current) drug therapy: Secondary | ICD-10-CM

## 2021-01-07 DIAGNOSIS — Y798 Miscellaneous orthopedic devices associated with adverse incidents, not elsewhere classified: Secondary | ICD-10-CM | POA: Diagnosis present

## 2021-01-07 DIAGNOSIS — Z79890 Hormone replacement therapy (postmenopausal): Secondary | ICD-10-CM | POA: Diagnosis not present

## 2021-01-07 DIAGNOSIS — X58XXXA Exposure to other specified factors, initial encounter: Secondary | ICD-10-CM | POA: Diagnosis present

## 2021-01-07 DIAGNOSIS — M797 Fibromyalgia: Secondary | ICD-10-CM | POA: Diagnosis present

## 2021-01-07 DIAGNOSIS — Z20822 Contact with and (suspected) exposure to covid-19: Secondary | ICD-10-CM | POA: Diagnosis present

## 2021-01-07 DIAGNOSIS — T84033A Mechanical loosening of internal left knee prosthetic joint, initial encounter: Principal | ICD-10-CM | POA: Diagnosis present

## 2021-01-07 DIAGNOSIS — Z96652 Presence of left artificial knee joint: Secondary | ICD-10-CM

## 2021-01-07 DIAGNOSIS — Z87891 Personal history of nicotine dependence: Secondary | ICD-10-CM | POA: Diagnosis not present

## 2021-01-07 DIAGNOSIS — S83422A Sprain of lateral collateral ligament of left knee, initial encounter: Secondary | ICD-10-CM | POA: Diagnosis present

## 2021-01-07 DIAGNOSIS — Y929 Unspecified place or not applicable: Secondary | ICD-10-CM | POA: Diagnosis not present

## 2021-01-07 DIAGNOSIS — M25562 Pain in left knee: Secondary | ICD-10-CM | POA: Diagnosis present

## 2021-01-07 DIAGNOSIS — F419 Anxiety disorder, unspecified: Secondary | ICD-10-CM | POA: Diagnosis present

## 2021-01-07 DIAGNOSIS — F32A Depression, unspecified: Secondary | ICD-10-CM | POA: Diagnosis present

## 2021-01-07 DIAGNOSIS — K219 Gastro-esophageal reflux disease without esophagitis: Secondary | ICD-10-CM | POA: Diagnosis present

## 2021-01-07 HISTORY — PX: TOTAL KNEE REVISION: SHX996

## 2021-01-07 LAB — TYPE AND SCREEN
ABO/RH(D): A POS
Antibody Screen: NEGATIVE

## 2021-01-07 SURGERY — TOTAL KNEE REVISION
Anesthesia: Monitor Anesthesia Care | Site: Knee | Laterality: Left

## 2021-01-07 MED ORDER — GLYCOPYRROLATE PF 0.2 MG/ML IJ SOSY
PREFILLED_SYRINGE | INTRAMUSCULAR | Status: DC | PRN
  Administered 2021-01-07: .1 mg via INTRAVENOUS

## 2021-01-07 MED ORDER — BUPIVACAINE-EPINEPHRINE 0.25% -1:200000 IJ SOLN
INTRAMUSCULAR | Status: DC | PRN
Start: 2021-01-07 — End: 2021-01-07
  Administered 2021-01-07: 30 mL

## 2021-01-07 MED ORDER — PAROXETINE HCL 20 MG PO TABS
40.0000 mg | ORAL_TABLET | Freq: Every day | ORAL | Status: DC
  Administered 2021-01-08: 40 mg via ORAL
  Filled 2021-01-07 (×2): qty 2

## 2021-01-07 MED ORDER — PHENYLEPHRINE HCL-NACL 10-0.9 MG/250ML-% IV SOLN
INTRAVENOUS | Status: DC | PRN
  Administered 2021-01-07: 20 ug/min via INTRAVENOUS

## 2021-01-07 MED ORDER — ACETAMINOPHEN 500 MG PO TABS: 1000.0000 mg | ORAL_TABLET | Freq: Once | ORAL | Status: DC

## 2021-01-07 MED ORDER — BISACODYL 10 MG RE SUPP: 10.0000 mg | Freq: Every day | RECTAL | Status: DC | PRN

## 2021-01-07 MED ORDER — ORAL CARE MOUTH RINSE
15.0000 mL | Freq: Once | OROMUCOSAL | Status: AC
  Administered 2021-01-07: 15 mL via OROMUCOSAL

## 2021-01-07 MED ORDER — POLYETHYLENE GLYCOL 3350 17 G PO PACK: 17.0000 g | PACK | Freq: Every day | ORAL | Status: DC | PRN

## 2021-01-07 MED ORDER — METHOCARBAMOL 500 MG IVPB - SIMPLE MED
500.0000 mg | Freq: Four times a day (QID) | INTRAVENOUS | Status: DC | PRN
  Filled 2021-01-07: qty 50

## 2021-01-07 MED ORDER — BUPROPION HCL ER (SR) 150 MG PO TB12
150.0000 mg | ORAL_TABLET | Freq: Two times a day (BID) | ORAL | Status: DC
  Administered 2021-01-07 – 2021-01-08 (×2): 150 mg via ORAL
  Filled 2021-01-07 (×2): qty 1

## 2021-01-07 MED ORDER — LIDOCAINE 2% (20 MG/ML) 5 ML SYRINGE
INTRAMUSCULAR | Status: AC
  Filled 2021-01-07: qty 5

## 2021-01-07 MED ORDER — PROPOFOL 10 MG/ML IV BOLUS
INTRAVENOUS | Status: AC
  Filled 2021-01-07: qty 20

## 2021-01-07 MED ORDER — LACTATED RINGERS IV SOLN: INTRAVENOUS | Status: DC

## 2021-01-07 MED ORDER — SODIUM CHLORIDE (PF) 0.9 % IJ SOLN
INTRAMUSCULAR | Status: AC
  Filled 2021-01-07: qty 10

## 2021-01-07 MED ORDER — DEXAMETHASONE SODIUM PHOSPHATE 10 MG/ML IJ SOLN
INTRAMUSCULAR | Status: DC | PRN
  Administered 2021-01-07: 5 mg via INTRAVENOUS

## 2021-01-07 MED ORDER — ONDANSETRON HCL 4 MG/2ML IJ SOLN
INTRAMUSCULAR | Status: AC
  Filled 2021-01-07: qty 2

## 2021-01-07 MED ORDER — ACETAMINOPHEN 325 MG PO TABS: 325.0000 mg | ORAL_TABLET | Freq: Four times a day (QID) | ORAL | Status: DC | PRN

## 2021-01-07 MED ORDER — DIPHENHYDRAMINE HCL 12.5 MG/5ML PO ELIX
12.5000 mg | ORAL_SOLUTION | ORAL | Status: DC | PRN
  Administered 2021-01-07: 25 mg via ORAL
  Filled 2021-01-07: qty 10

## 2021-01-07 MED ORDER — CHLORHEXIDINE GLUCONATE CLOTH 2 % EX PADS
6.0000 | MEDICATED_PAD | Freq: Every day | CUTANEOUS | Status: DC
  Administered 2021-01-08: 6 via TOPICAL

## 2021-01-07 MED ORDER — DEXAMETHASONE SODIUM PHOSPHATE 10 MG/ML IJ SOLN
INTRAMUSCULAR | Status: DC | PRN
  Administered 2021-01-07: 10 mg via INTRAVENOUS

## 2021-01-07 MED ORDER — FERROUS SULFATE 325 (65 FE) MG PO TABS
325.0000 mg | ORAL_TABLET | Freq: Three times a day (TID) | ORAL | Status: DC
  Administered 2021-01-08 (×2): 325 mg via ORAL
  Filled 2021-01-07 (×2): qty 1

## 2021-01-07 MED ORDER — DEXAMETHASONE SODIUM PHOSPHATE 10 MG/ML IJ SOLN
INTRAMUSCULAR | Status: AC
  Filled 2021-01-07: qty 1

## 2021-01-07 MED ORDER — TOBRAMYCIN SULFATE 1.2 G IJ SOLR
INTRAMUSCULAR | Status: AC
  Filled 2021-01-07: qty 2.4

## 2021-01-07 MED ORDER — HYDROMORPHONE HCL 1 MG/ML IJ SOLN
0.2500 mg | INTRAMUSCULAR | Status: DC | PRN
  Administered 2021-01-07 (×2): 0.25 mg via INTRAVENOUS

## 2021-01-07 MED ORDER — CHLORHEXIDINE GLUCONATE 0.12 % MT SOLN: 15.0000 mL | Freq: Once | OROMUCOSAL | Status: AC

## 2021-01-07 MED ORDER — CEFAZOLIN SODIUM-DEXTROSE 2-4 GM/100ML-% IV SOLN
2.0000 g | INTRAVENOUS | Status: AC
  Administered 2021-01-07: 2 g via INTRAVENOUS
  Filled 2021-01-07: qty 100

## 2021-01-07 MED ORDER — LORATADINE 10 MG PO TABS
10.0000 mg | ORAL_TABLET | Freq: Every day | ORAL | Status: DC
  Administered 2021-01-07 – 2021-01-08 (×2): 10 mg via ORAL
  Filled 2021-01-07 (×2): qty 1

## 2021-01-07 MED ORDER — PROPOFOL 10 MG/ML IV BOLUS
INTRAVENOUS | Status: DC | PRN
  Administered 2021-01-07 (×2): 20 mg via INTRAVENOUS

## 2021-01-07 MED ORDER — SODIUM CHLORIDE 0.9 % IR SOLN
Status: DC | PRN
  Administered 2021-01-07: 3000 mL

## 2021-01-07 MED ORDER — HYDROCODONE-ACETAMINOPHEN 7.5-325 MG PO TABS
1.0000 | ORAL_TABLET | ORAL | Status: DC | PRN
  Administered 2021-01-08: 2 via ORAL
  Filled 2021-01-07: qty 2

## 2021-01-07 MED ORDER — LIDOCAINE HCL (CARDIAC) PF 100 MG/5ML IV SOSY
PREFILLED_SYRINGE | INTRAVENOUS | Status: DC | PRN
  Administered 2021-01-07: 20 mg via INTRATRACHEAL

## 2021-01-07 MED ORDER — CELECOXIB 200 MG PO CAPS
200.0000 mg | ORAL_CAPSULE | Freq: Two times a day (BID) | ORAL | Status: DC
  Administered 2021-01-07 – 2021-01-08 (×2): 200 mg via ORAL
  Filled 2021-01-07 (×2): qty 1

## 2021-01-07 MED ORDER — CEFAZOLIN SODIUM-DEXTROSE 2-4 GM/100ML-% IV SOLN
2.0000 g | Freq: Four times a day (QID) | INTRAVENOUS | Status: AC
  Administered 2021-01-07 (×2): 2 g via INTRAVENOUS
  Filled 2021-01-07 (×2): qty 100

## 2021-01-07 MED ORDER — ASPIRIN 81 MG PO CHEW
81.0000 mg | CHEWABLE_TABLET | Freq: Two times a day (BID) | ORAL | Status: DC
  Administered 2021-01-07 – 2021-01-08 (×2): 81 mg via ORAL
  Filled 2021-01-07 (×2): qty 1

## 2021-01-07 MED ORDER — PROPOFOL 500 MG/50ML IV EMUL
INTRAVENOUS | Status: DC | PRN
  Administered 2021-01-07: 70 ug/kg/min via INTRAVENOUS

## 2021-01-07 MED ORDER — LEVOTHYROXINE SODIUM 88 MCG PO TABS
88.0000 ug | ORAL_TABLET | Freq: Every day | ORAL | Status: DC
  Administered 2021-01-08: 88 ug via ORAL
  Filled 2021-01-07: qty 1

## 2021-01-07 MED ORDER — ONDANSETRON HCL 4 MG PO TABS: 4.0000 mg | ORAL_TABLET | Freq: Four times a day (QID) | ORAL | Status: DC | PRN

## 2021-01-07 MED ORDER — PHENOL 1.4 % MT LIQD: 1.0000 | OROMUCOSAL | Status: DC | PRN

## 2021-01-07 MED ORDER — FENTANYL CITRATE (PF) 100 MCG/2ML IJ SOLN
50.0000 ug | INTRAMUSCULAR | Status: DC
  Administered 2021-01-07: 50 ug via INTRAVENOUS
  Filled 2021-01-07: qty 2

## 2021-01-07 MED ORDER — SODIUM CHLORIDE (PF) 0.9 % IJ SOLN
INTRAMUSCULAR | Status: DC | PRN
  Administered 2021-01-07: 30 mL

## 2021-01-07 MED ORDER — SODIUM CHLORIDE 0.9 % IR SOLN
Status: DC | PRN
  Administered 2021-01-07: 1000 mL

## 2021-01-07 MED ORDER — HYDROCODONE-ACETAMINOPHEN 5-325 MG PO TABS
1.0000 | ORAL_TABLET | ORAL | Status: DC | PRN
  Administered 2021-01-07 – 2021-01-08 (×4): 2 via ORAL
  Filled 2021-01-07 (×4): qty 2

## 2021-01-07 MED ORDER — AMISULPRIDE (ANTIEMETIC) 5 MG/2ML IV SOLN: 10.0000 mg | Freq: Once | INTRAVENOUS | Status: DC | PRN

## 2021-01-07 MED ORDER — METHOCARBAMOL 500 MG PO TABS
500.0000 mg | ORAL_TABLET | Freq: Four times a day (QID) | ORAL | Status: DC | PRN
  Administered 2021-01-07 – 2021-01-08 (×2): 500 mg via ORAL
  Filled 2021-01-07 (×2): qty 1

## 2021-01-07 MED ORDER — HYDROMORPHONE HCL 1 MG/ML IJ SOLN
INTRAMUSCULAR | Status: AC
  Administered 2021-01-07: 0.5 mg via INTRAVENOUS
  Filled 2021-01-07: qty 1

## 2021-01-07 MED ORDER — GLYCOPYRROLATE PF 0.2 MG/ML IJ SOSY
PREFILLED_SYRINGE | INTRAMUSCULAR | Status: AC
  Filled 2021-01-07: qty 1

## 2021-01-07 MED ORDER — SODIUM CHLORIDE 0.9 % IV SOLN: INTRAVENOUS | Status: DC

## 2021-01-07 MED ORDER — DEXAMETHASONE SODIUM PHOSPHATE 10 MG/ML IJ SOLN: 8.0000 mg | Freq: Once | INTRAMUSCULAR | Status: DC

## 2021-01-07 MED ORDER — DEXAMETHASONE SODIUM PHOSPHATE 10 MG/ML IJ SOLN
10.0000 mg | Freq: Once | INTRAMUSCULAR | Status: AC
  Administered 2021-01-08: 10 mg via INTRAVENOUS
  Filled 2021-01-07: qty 1

## 2021-01-07 MED ORDER — MENTHOL 3 MG MT LOZG: 1.0000 | LOZENGE | OROMUCOSAL | Status: DC | PRN

## 2021-01-07 MED ORDER — BUPIVACAINE-EPINEPHRINE (PF) 0.25% -1:200000 IJ SOLN
INTRAMUSCULAR | Status: AC
  Filled 2021-01-07: qty 30

## 2021-01-07 MED ORDER — DEXMEDETOMIDINE (PRECEDEX) IN NS 20 MCG/5ML (4 MCG/ML) IV SYRINGE
PREFILLED_SYRINGE | INTRAVENOUS | Status: DC | PRN
Start: 2021-01-07 — End: 2021-01-07
  Administered 2021-01-07: 8 ug via INTRAVENOUS

## 2021-01-07 MED ORDER — VANCOMYCIN HCL 1000 MG IV SOLR
INTRAVENOUS | Status: AC
  Filled 2021-01-07: qty 3000

## 2021-01-07 MED ORDER — PHENYLEPHRINE HCL (PRESSORS) 10 MG/ML IV SOLN
INTRAVENOUS | Status: AC
  Filled 2021-01-07: qty 1

## 2021-01-07 MED ORDER — ONDANSETRON HCL 4 MG/2ML IJ SOLN
INTRAMUSCULAR | Status: DC | PRN
  Administered 2021-01-07: 4 mg via INTRAVENOUS

## 2021-01-07 MED ORDER — GABAPENTIN 300 MG PO CAPS
300.0000 mg | ORAL_CAPSULE | Freq: Every day | ORAL | Status: DC
  Administered 2021-01-07 – 2021-01-08 (×2): 300 mg via ORAL
  Filled 2021-01-07 (×2): qty 1

## 2021-01-07 MED ORDER — TRANEXAMIC ACID-NACL 1000-0.7 MG/100ML-% IV SOLN
1000.0000 mg | Freq: Once | INTRAVENOUS | Status: AC
  Administered 2021-01-07: 1000 mg via INTRAVENOUS
  Filled 2021-01-07: qty 100

## 2021-01-07 MED ORDER — MORPHINE SULFATE (PF) 2 MG/ML IV SOLN: 0.5000 mg | INTRAVENOUS | Status: DC | PRN

## 2021-01-07 MED ORDER — DOCUSATE SODIUM 100 MG PO CAPS
100.0000 mg | ORAL_CAPSULE | Freq: Two times a day (BID) | ORAL | Status: DC
  Administered 2021-01-07 – 2021-01-08 (×2): 100 mg via ORAL
  Filled 2021-01-07 (×2): qty 1

## 2021-01-07 MED ORDER — BUPIVACAINE IN DEXTROSE 0.75-8.25 % IT SOLN
INTRATHECAL | Status: DC | PRN
  Administered 2021-01-07: 1.6 mL via INTRATHECAL

## 2021-01-07 MED ORDER — STERILE WATER FOR IRRIGATION IR SOLN
Status: DC | PRN
  Administered 2021-01-07: 2000 mL

## 2021-01-07 MED ORDER — PROMETHAZINE HCL 25 MG/ML IJ SOLN: 6.2500 mg | INTRAMUSCULAR | Status: DC | PRN

## 2021-01-07 MED ORDER — ONDANSETRON HCL 4 MG/2ML IJ SOLN: 4.0000 mg | Freq: Four times a day (QID) | INTRAMUSCULAR | Status: DC | PRN

## 2021-01-07 MED ORDER — KETOROLAC TROMETHAMINE 30 MG/ML IJ SOLN
INTRAMUSCULAR | Status: AC
  Filled 2021-01-07: qty 1

## 2021-01-07 MED ORDER — TRANEXAMIC ACID-NACL 1000-0.7 MG/100ML-% IV SOLN
1000.0000 mg | INTRAVENOUS | Status: AC
  Administered 2021-01-07: 1000 mg via INTRAVENOUS
  Filled 2021-01-07: qty 100

## 2021-01-07 MED ORDER — HYDROCODONE-ACETAMINOPHEN 7.5-325 MG PO TABS: 1.0000 | ORAL_TABLET | Freq: Once | ORAL | Status: DC | PRN

## 2021-01-07 MED ORDER — ROPIVACAINE HCL 5 MG/ML IJ SOLN
INTRAMUSCULAR | Status: DC | PRN
  Administered 2021-01-07: 30 mL via PERINEURAL

## 2021-01-07 MED ORDER — KETOROLAC TROMETHAMINE 30 MG/ML IJ SOLN
INTRAMUSCULAR | Status: DC | PRN
  Administered 2021-01-07: 30 mg

## 2021-01-07 MED ORDER — POVIDONE-IODINE 10 % EX SWAB
2.0000 "application " | Freq: Once | CUTANEOUS | Status: DC
  Administered 2021-01-07: 2 via TOPICAL

## 2021-01-07 MED ORDER — METOCLOPRAMIDE HCL 5 MG PO TABS: 5.0000 mg | ORAL_TABLET | Freq: Three times a day (TID) | ORAL | Status: DC | PRN

## 2021-01-07 MED ORDER — METOCLOPRAMIDE HCL 5 MG/ML IJ SOLN
5.0000 mg | Freq: Three times a day (TID) | INTRAMUSCULAR | Status: DC | PRN
Start: 2021-01-07 — End: 2021-01-08

## 2021-01-07 MED ORDER — MIDAZOLAM HCL 2 MG/2ML IJ SOLN
1.0000 mg | INTRAMUSCULAR | Status: DC
  Administered 2021-01-07: 1 mg via INTRAVENOUS
  Filled 2021-01-07: qty 2

## 2021-01-07 SURGICAL SUPPLY — 64 items
ADH SKN CLS APL DERMABOND .7 (GAUZE/BANDAGES/DRESSINGS) ×1
ATTUNE MED DOME PAT 38 KNEE (Knees) ×1 IMPLANT
BAG DECANTER FOR FLEXI CONT (MISCELLANEOUS) ×1 IMPLANT
BAG SPEC THK2 15X12 ZIP CLS (MISCELLANEOUS)
BAG ZIPLOCK 12X15 (MISCELLANEOUS) IMPLANT
BLADE SAW SGTL 11.0X1.19X90.0M (BLADE) IMPLANT
BLADE SAW SGTL 13.0X1.19X90.0M (BLADE) ×2 IMPLANT
BLADE SAW SGTL 81X20 HD (BLADE) ×2 IMPLANT
BLADE SURG SZ10 CARB STEEL (BLADE) ×4 IMPLANT
BNDG ELASTIC 6X5.8 VLCR STR LF (GAUZE/BANDAGES/DRESSINGS) ×2 IMPLANT
BRUSH FEMORAL CANAL (MISCELLANEOUS) IMPLANT
BSPLAT TIB 3 CMNT REV ROT PLAT (Knees) ×1 IMPLANT
CEMENT HV SMART SET (Cement) ×2 IMPLANT
CEMENT RESTRICTOR DEPUY SZ 4 (Cement) ×1 IMPLANT
COVER SURGICAL LIGHT HANDLE (MISCELLANEOUS) ×2 IMPLANT
COVER WAND RF STERILE (DRAPES) ×1 IMPLANT
CUFF TOURN SGL QUICK 34 (TOURNIQUET CUFF) ×2
CUFF TRNQT CYL 34X4.125X (TOURNIQUET CUFF) ×1 IMPLANT
DECANTER SPIKE VIAL GLASS SM (MISCELLANEOUS) ×1 IMPLANT
DERMABOND ADVANCED (GAUZE/BANDAGES/DRESSINGS) ×1
DERMABOND ADVANCED .7 DNX12 (GAUZE/BANDAGES/DRESSINGS) ×1 IMPLANT
DRAPE U-SHAPE 47X51 STRL (DRAPES) ×2 IMPLANT
DRESSING AQUACEL AG SP 3.5X10 (GAUZE/BANDAGES/DRESSINGS) IMPLANT
DRSG AQUACEL AG ADV 3.5X14 (GAUZE/BANDAGES/DRESSINGS) ×1 IMPLANT
DRSG AQUACEL AG SP 3.5X10 (GAUZE/BANDAGES/DRESSINGS) ×2
DURAPREP 26ML APPLICATOR (WOUND CARE) ×4 IMPLANT
ELECT REM PT RETURN 15FT ADLT (MISCELLANEOUS) ×2 IMPLANT
GLOVE SURG UNDER POLY LF SZ7.5 (GLOVE) ×4 IMPLANT
GOWN STRL REUS W/TWL LRG LVL3 (GOWN DISPOSABLE) ×2 IMPLANT
HANDPIECE INTERPULSE COAX TIP (DISPOSABLE) ×2
HOLDER FOLEY CATH W/STRAP (MISCELLANEOUS) ×1 IMPLANT
INSERT REV ATTUNE KNEE 16 (Insert) ×1 IMPLANT
INSERT TIB CMT ATTUNE RP SZ3 (Knees) ×1 IMPLANT
IRRIGATION SURGIPHOR STRL (IV SOLUTION) IMPLANT
KIT TURNOVER KIT A (KITS) ×2 IMPLANT
KNEE STEM FEM SROM XSM LFT (Knees) ×1 IMPLANT
MANIFOLD NEPTUNE II (INSTRUMENTS) ×2 IMPLANT
NDL SAFETY ECLIPSE 18X1.5 (NEEDLE) IMPLANT
NEEDLE HYPO 18GX1.5 SHARP (NEEDLE) ×2
NS IRRIG 1000ML POUR BTL (IV SOLUTION) ×2 IMPLANT
PACK TOTAL KNEE CUSTOM (KITS) ×2 IMPLANT
PADDING CAST COTTON 6X4 STRL (CAST SUPPLIES) ×1 IMPLANT
PENCIL SMOKE EVACUATOR (MISCELLANEOUS) ×2 IMPLANT
PIN FIX SIGMA LCS THRD HI (PIN) ×1 IMPLANT
PROTECTOR NERVE ULNAR (MISCELLANEOUS) ×2 IMPLANT
SET HNDPC FAN SPRY TIP SCT (DISPOSABLE) ×1 IMPLANT
SET PAD KNEE POSITIONER (MISCELLANEOUS) ×2 IMPLANT
SLEEVE FEM UNIV DIST PRO SZ 20 (Sleeve) ×1 IMPLANT
SLEEVE KNEE ATTUNE 29MM (Knees) ×1 IMPLANT
SPONGE LAP 18X18 RF (DISPOSABLE) ×1 IMPLANT
STAPLER VISISTAT 35W (STAPLE) IMPLANT
STEM STR ATTUNE PF 14X60 (Knees) ×1 IMPLANT
STEM TIBIA PFC 13X60MM (Stem) ×1 IMPLANT
SUT MNCRL AB 3-0 PS2 18 (SUTURE) ×2 IMPLANT
SUT STRATAFIX PDS+ 0 24IN (SUTURE) ×2 IMPLANT
SUT VIC AB 1 CT1 36 (SUTURE) ×2 IMPLANT
SUT VIC AB 2-0 CT1 27 (SUTURE) ×6
SUT VIC AB 2-0 CT1 TAPERPNT 27 (SUTURE) ×3 IMPLANT
SYR 50ML LL SCALE MARK (SYRINGE) IMPLANT
TOWER CARTRIDGE SMART MIX (DISPOSABLE) ×2 IMPLANT
TUBE KAMVAC SUCTION (TUBING) IMPLANT
TUBE SUCTION HIGH CAP CLEAR NV (SUCTIONS) ×2 IMPLANT
WATER STERILE IRR 1000ML POUR (IV SOLUTION) ×3 IMPLANT
WRAP KNEE MAXI GEL POST OP (GAUZE/BANDAGES/DRESSINGS) ×2 IMPLANT

## 2021-01-07 NOTE — Brief Op Note (Signed)
01/07/2021  8:51 AM  PATIENT:  Felicia Cooper  70 y.o. 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   Delay start of Pharmacological VTE agent (>24hrs) due to surgical blood loss or risk of bleeding: no

## 2021-01-07 NOTE — Transfer of Care (Signed)
Immediate Anesthesia Transfer of Care Note  Patient: Felicia Cooper  Procedure(s) Performed: TOTAL KNEE REVISION, HINGED KNEE (Left Knee)  Patient Location: PACU  Anesthesia Type:Spinal and MAC combined with regional for post-op pain  Level of Consciousness: awake, alert , oriented and patient cooperative  Airway & Oxygen Therapy: Patient Spontanous Breathing and Patient connected to face mask oxygen  Post-op Assessment: Report given to RN and Post -op Vital signs reviewed and stable  Post vital signs: Reviewed and stable  Last Vitals:  Vitals Value Taken Time  BP 121/55 01/07/21 1232  Temp    Pulse 87 01/07/21 1233  Resp 19 01/07/21 1233  SpO2 96 % 01/07/21 1233  Vitals shown include unvalidated device data.  Last Pain:  Vitals:   01/07/21 0624  TempSrc: Oral         Complications: No complications documented.

## 2021-01-07 NOTE — Interval H&P Note (Signed)
History and Physical Interval Note:  01/07/2021 7:09 AM  Felicia Cooper  has presented today for surgery, with the diagnosis of Failed left total knee arthroplasty.  The various methods of treatment have been discussed with the patient and family. After consideration of risks, benefits and other options for treatment, the patient has consented to  Procedure(s): TOTAL KNEE REVISION, Earth (Left) as a surgical intervention.  The patient's history has been reviewed, patient examined, no change in status, stable for surgery.  I have reviewed the patient's chart and labs.  Questions were answered to the patient's satisfaction.     Mauri Pole

## 2021-01-07 NOTE — Discharge Instructions (Signed)

## 2021-01-07 NOTE — Anesthesia Preprocedure Evaluation (Addendum)
Anesthesia Evaluation  Patient identified by MRN, date of birth, ID band Patient awake    Reviewed: Allergy & Precautions, NPO status , Patient's Chart, lab work & pertinent test results  Airway Mallampati: II  TM Distance: >3 FB Neck ROM: Full    Dental  (+) Teeth Intact, Dental Advisory Given,    Pulmonary former smoker,  Quit smoking 1974   Pulmonary exam normal breath sounds clear to auscultation       Cardiovascular negative cardio ROS Normal cardiovascular exam Rhythm:Regular Rate:Normal     Neuro/Psych PSYCHIATRIC DISORDERS Anxiety Depression negative neurological ROS     GI/Hepatic Neg liver ROS, GERD  Controlled and Medicated,  Endo/Other  Hypothyroidism Morbid obesityBMI 40  Renal/GU negative Renal ROS  negative genitourinary   Musculoskeletal  (+) Arthritis , Osteoarthritis,  Fibromyalgia -Failed L total knee   Abdominal (+) + obese,   Peds negative pediatric ROS (+)  Hematology negative hematology ROS (+) hct 45.2, plt 255   Anesthesia Other Findings   Reproductive/Obstetrics negative OB ROS                            Anesthesia Physical Anesthesia Plan  ASA: III  Anesthesia Plan: Spinal, MAC and Regional   Post-op Pain Management:  Regional for Post-op pain   Induction:   PONV Risk Score and Plan: 2 and Propofol infusion and TIVA  Airway Management Planned: Natural Airway and Nasal Cannula  Additional Equipment: None  Intra-op Plan:   Post-operative Plan:   Informed Consent: I have reviewed the patients History and Physical, chart, labs and discussed the procedure including the risks, benefits and alternatives for the proposed anesthesia with the patient or authorized representative who has indicated his/her understanding and acceptance.       Plan Discussed with: CRNA  Anesthesia Plan Comments:        Anesthesia Quick Evaluation

## 2021-01-07 NOTE — Anesthesia Procedure Notes (Signed)
Anesthesia Regional Block: Adductor canal block   Pre-Anesthetic Checklist: ,, timeout performed, Correct Patient, Correct Site, Correct Laterality, Correct Procedure, Correct Position, site marked, Risks and benefits discussed,  Surgical consent,  Pre-op evaluation,  At surgeon's request and post-op pain management  Laterality: Left  Prep: Maximum Sterile Barrier Precautions used, chloraprep       Needles:  Injection technique: Single-shot  Needle Type: Echogenic Stimulator Needle     Needle Length: 9cm  Needle Gauge: 22     Additional Needles:   Procedures:,,,, ultrasound used (permanent image in chart),,,,  Narrative:  Start time: 01/07/2021 8:15 AM End time: 01/07/2021 8:20 AM Injection made incrementally with aspirations every 5 mL.  Performed by: Personally  Anesthesiologist: Pervis Hocking, DO  Additional Notes: Monitors applied. No increased pain on injection. No increased resistance to injection. Injection made in 5cc increments. Good needle visualization. Patient tolerated procedure well.

## 2021-01-07 NOTE — Evaluation (Signed)
Physical Therapy Evaluation Patient Details Name: Felicia Cooper MRN: [id number] DOB: Oct 18, 1950 Today's Date: 01/07/2021   History of Present Illness  Pt s/p L TKR revision to hinged knee.  Pt with hx of bil TKR and fibromyalgia  Clinical Impression  Pt s/p L TKR revision and presents with decreased L LE strength/ROM and post op pain limiting functional mobility.  Pt should progress to dc home with assist of family.    Follow Up Recommendations Follow surgeon's recommendation for DC plan and follow-up therapies    Equipment Recommendations  None recommended by PT    Recommendations for Other Services       Precautions / Restrictions Precautions Precautions: Fall;Knee Restrictions Weight Bearing Restrictions: No LLE Weight Bearing: Weight bearing as tolerated      Mobility  Bed Mobility Overal bed mobility: Needs Assistance Bed Mobility: Supine to Sit     Supine to sit: Min assist     General bed mobility comments: cues for sequence and use of R LE to self assist    Transfers Overall transfer level: Needs assistance Equipment used: Rolling walker (2 wheeled) Transfers: Sit to/from Stand Sit to Stand: Min assist         General transfer comment: cues for LE management and use of UEs to self assist.  Ambulation/Gait Ambulation/Gait assistance: Min assist Gait Distance (Feet): 90 Feet Assistive device: Rolling walker (2 wheeled) Gait Pattern/deviations: Step-to pattern;Decreased step length - right;Decreased step length - left;Shuffle;Trunk flexed     General Gait Details: cues for sequence, posture and position from [employer]            Wheelchair Mobility    Modified Rankin (Stroke Patients Only)       Balance Overall balance assessment: Needs assistance Sitting-balance support: No upper extremity supported;Feet supported Sitting balance-Leahy Scale: Good     Standing balance support: Bilateral upper extremity supported Standing  balance-Leahy Scale: Poor                               Pertinent Vitals/Pain Pain Assessment: 0-10 Pain Score: 4  Pain Location: L knee Pain Descriptors / Indicators: Aching;Sore Pain Intervention(s): Limited activity within patient's tolerance;Monitored during session;Premedicated before session;Ice applied    Home Living Family/patient expects to be discharged to:: Private residence Living Arrangements: Alone Available Help at Discharge: Family Type of Home: House Home Access: Stairs to enter Entrance Stairs-Rails: None Entrance Stairs-Number of Steps: 2+1 Home Layout: One level Home Equipment: Environmental consultant - 2 wheels;Cane - single point;Bedside commode      Prior Function Level of Independence: Independent               Hand Dominance        Extremity/Trunk Assessment   Upper Extremity Assessment Upper Extremity Assessment: Overall WFL for tasks assessed    Lower Extremity Assessment Lower Extremity Assessment: LLE deficits/detail    Cervical / Trunk Assessment Cervical / Trunk Assessment: Normal  Communication   Communication: No difficulties  Cognition Arousal/Alertness: Awake/alert Behavior During Therapy: WFL for tasks assessed/performed Overall Cognitive Status: Within Functional Limits for tasks assessed                                        General Comments      Exercises Total Joint Exercises Ankle Circles/Pumps: AROM;Both;15 reps;Supine   Assessment/Plan  PT Assessment Patient needs continued PT services  PT Problem List Decreased range of motion;Decreased strength;Decreased activity tolerance;Decreased balance;Decreased mobility;Decreased knowledge of use of DME;Pain       PT Treatment Interventions DME instruction;Gait training;Stair training;Functional mobility training;Therapeutic activities;Therapeutic exercise;Patient/family education    PT Goals (Current goals can be found in the Care Plan section)   Acute Rehab PT Goals Patient Stated Goal: REgain IND PT Goal Formulation: With patient Time For Goal Achievement: 01/14/21 Potential to Achieve Goals: Good    Frequency 7X/week   Barriers to discharge        Co-evaluation               AM-PAC PT "6 Clicks" Mobility  Outcome Measure Help needed turning from your back to your side while in a flat bed without using bedrails?: A Little Help needed moving from lying on your back to sitting on the side of a flat bed without using bedrails?: A Little Help needed moving to and from a bed to a chair (including a wheelchair)?: A Little Help needed standing up from a chair using your arms (e.g., wheelchair or bedside chair)?: A Little Help needed to walk in hospital room?: A Little Help needed climbing 3-5 steps with a railing? : A Lot 6 Click Score: 17    End of Session Equipment Utilized During Treatment: Gait belt Activity Tolerance: Patient tolerated treatment well Patient left: in chair;with call bell/phone within reach;with chair alarm set;with family/visitor present Nurse Communication: Mobility status PT Visit Diagnosis: Difficulty in walking, not elsewhere classified (R26.2)    Time: 7782-4235 PT Time Calculation (min) (ACUTE ONLY): 26 min   Charges:   PT Evaluation $PT Eval Low Complexity: 1 Low PT Treatments $Gait Training: 8-22 mins        [place] Pager [phone] Office [phone]   Devan Danzer 01/07/2021, 4:19 PM

## 2021-01-07 NOTE — Progress Notes (Signed)
Assisted Dr. Beth Finucane with left, ultrasound guided, adductor canal block. Side rails up, monitors on throughout procedure. See vital signs in flow sheet. Tolerated Procedure well. ° °

## 2021-01-07 NOTE — Anesthesia Procedure Notes (Signed)
Spinal  Patient location during procedure: OR Start time: 01/07/2021 9:30 AM End time: 01/07/2021 9:38 AM Reason for block: surgical anesthesia Staffing Performed: anesthesiologist  Anesthesiologist: Pervis Hocking, DO Preanesthetic Checklist Completed: patient identified, IV checked, risks and benefits discussed, surgical consent, monitors and equipment checked, pre-op evaluation and timeout performed Spinal Block Patient position: sitting Prep: DuraPrep and site prepped and draped Patient monitoring: cardiac monitor, continuous pulse ox and blood pressure Approach: midline Location: L3-4 Injection technique: single-shot Needle Needle type: Pencan  Needle gauge: 24 G Needle length: 9 cm Assessment Sensory level: T6 Events: CSF return and second provider Additional Notes Functioning IV was confirmed and monitors were applied. Sterile prep and drape, including hand hygiene and sterile gloves were used. The patient was positioned and the spine was prepped. The skin was anesthetized with lidocaine.  Free flow of clear CSF was obtained prior to injecting local anesthetic into the CSF.  The spinal needle aspirated freely following injection.  The needle was carefully withdrawn.  The patient tolerated the procedure well.

## 2021-01-08 ENCOUNTER — Encounter (HOSPITAL_COMMUNITY): Payer: Self-pay | Admitting: Orthopedic Surgery

## 2021-01-08 LAB — BASIC METABOLIC PANEL
Anion gap: 5 (ref 5–15)
BUN: 20 mg/dL (ref 8–23)
CO2: 28 mmol/L (ref 22–32)
Calcium: 8.6 mg/dL — ABNORMAL LOW (ref 8.9–10.3)
Chloride: 107 mmol/L (ref 98–111)
Creatinine, Ser: 0.83 mg/dL (ref 0.44–1.00)
GFR, Estimated: >60 mL/min (ref >60)
Glucose, Bld: 134 mg/dL — ABNORMAL HIGH (ref 70–99)
Potassium: 4.1 mmol/L (ref 3.5–5.1)
Sodium: 140 mmol/L (ref 135–145)

## 2021-01-08 LAB — CBC
HCT: 34.4 % — ABNORMAL LOW (ref 36.0–46.0)
Hemoglobin: 11.2 g/dL — ABNORMAL LOW (ref 12.0–15.0)
MCH: 30.4 pg (ref 26.0–34.0)
MCHC: 32.6 g/dL (ref 30.0–36.0)
MCV: 93.5 fL (ref 80.0–100.0)
Platelets: 199 10*3/uL (ref 150–400)
RBC: 3.68 MIL/uL — ABNORMAL LOW (ref 3.87–5.11)
RDW: 12.8 % (ref 11.5–15.5)
WBC: 13.4 10*3/uL — ABNORMAL HIGH (ref 4.0–10.5)
nRBC: 0 % (ref 0.0–0.2)

## 2021-01-08 MED ORDER — DOCUSATE SODIUM 100 MG PO CAPS: 100.0000 mg | ORAL_CAPSULE | Freq: Two times a day (BID) | ORAL | 0 refills | Status: DC

## 2021-01-08 MED ORDER — POLYETHYLENE GLYCOL 3350 17 G PO PACK: 17.0000 g | PACK | Freq: Every day | ORAL | 0 refills | Status: DC | PRN

## 2021-01-08 MED ORDER — FLUCONAZOLE 150 MG PO TABS
150.0000 mg | ORAL_TABLET | Freq: Once | ORAL | Status: AC
  Administered 2021-01-08: 150 mg via ORAL
  Filled 2021-01-08: qty 1

## 2021-01-08 MED ORDER — POLYETHYLENE GLYCOL 3350 17 G PO PACK
17.0000 g | PACK | Freq: Every day | ORAL | 0 refills | Status: DC | PRN
Start: 2021-01-08 — End: 2021-01-08

## 2021-01-08 MED ORDER — METHOCARBAMOL 500 MG PO TABS: 500.0000 mg | ORAL_TABLET | Freq: Four times a day (QID) | ORAL | 0 refills | Status: DC | PRN

## 2021-01-08 MED ORDER — CELECOXIB 200 MG PO CAPS: 200.0000 mg | ORAL_CAPSULE | Freq: Two times a day (BID) | ORAL | 0 refills | Status: DC

## 2021-01-08 MED ORDER — HYDROCODONE-ACETAMINOPHEN 5-325 MG PO TABS: 1.0000 | ORAL_TABLET | ORAL | 0 refills | Status: DC | PRN

## 2021-01-08 MED ORDER — ASPIRIN 81 MG PO CHEW: 81.0000 mg | CHEWABLE_TABLET | Freq: Two times a day (BID) | ORAL | 0 refills | Status: DC

## 2021-01-08 MED ORDER — ASPIRIN 81 MG PO CHEW: 81.0000 mg | CHEWABLE_TABLET | Freq: Two times a day (BID) | ORAL | 0 refills | Status: AC

## 2021-01-08 NOTE — TOC Transition Note (Signed)
Transition of Care [facility] - CM/SW Discharge Note  Patient Details  Name: Felicia Cooper MRN: [id number] Date of Birth: 10/09/1950  Transition of [facility] CM/SW Contact:  Sherie Don, LCSW Phone Number: 01/08/2021, 10:38 AM  Clinical Narrative: Patient is expected to discharge home after working with PT. CSW met with patient to confirm discharge plan. Patient will discharge home with a home exercise program (HEP) and will transition to OPPT on June 1. Patient has a rolling walker, cane, and 3N1 at home so there are no DME needs at this time. TOC signing off.  Final next level of care: Skilled Nursing Facility Barriers to Discharge: No Barriers Identified  Patient Goals and CMS Choice Patient states their goals for this hospitalization and ongoing recovery are:: Discharge home with HEP and transition to [facility] CMS Medicare.gov Compare Post Acute Care list provided to:: Patient Choice offered to / list presented to : Patient  Discharge Plan and Services       DME Arranged: N/A DME Agency: NA  Readmission Risk Interventions No flowsheet data found.

## 2021-01-08 NOTE — Progress Notes (Signed)
Physical Therapy Treatment Patient Details Name: Felicia Cooper MRN: [id number] DOB: 1951/01/03 Today's Date: 01/08/2021    History of Present Illness Pt s/p L TKR revision to hinged knee.  Pt with hx of bil TKR and fibromyalgia    PT Comments    Pt continues to progress well with mobility.  Up to ambulate in halls this pm, negotiated stairs and reviewed written HEP.  Pt eager for dc home this date.   Follow Up Recommendations  Follow surgeon's recommendation for DC plan and follow-up therapies     Equipment Recommendations  None recommended by PT    Recommendations for Other Services       Precautions / Restrictions Precautions Precautions: Fall;Knee Restrictions Weight Bearing Restrictions: No LLE Weight Bearing: Weight bearing as tolerated    Mobility  Bed Mobility Overal bed mobility: Needs Assistance Bed Mobility: Supine to Sit;Sit to Supine     Supine to sit: Supervision Sit to supine: Supervision   General bed mobility comments: cues for sequence and use of R LE to self assist    Transfers Overall transfer level: Needs assistance Equipment used: Rolling walker (2 wheeled) Transfers: Sit to/from Stand Sit to Stand: Supervision         General transfer comment: self-cues for LE management and use of UEs to self assist.  Ambulation/Gait Ambulation/Gait assistance: Min guard;Supervision Gait Distance (Feet): 120 Feet Assistive device: Rolling walker (2 wheeled) Gait Pattern/deviations: Decreased step length - right;Decreased step length - left;Shuffle;Trunk flexed;Step-to pattern;Step-through pattern Gait velocity: decr   General Gait Details: min cues for sequence, posture and position from [employer] Stairs: Yes   Stair Management: No rails;Step to pattern;With walker;Forwards;Backwards Number of Stairs: 5 General stair comments: 2 step bkwd, single step x 3 - twice fwd and once bkwd with RW - cues for sequence and foot/RW  placement   Wheelchair Mobility    Modified Rankin (Stroke Patients Only)       Balance Overall balance assessment: Needs assistance Sitting-balance support: No upper extremity supported;Feet supported Sitting balance-Leahy Scale: Good     Standing balance support: No upper extremity supported Standing balance-Leahy Scale: Fair                              Cognition Arousal/Alertness: Awake/alert Behavior During Therapy: WFL for tasks assessed/performed Overall Cognitive Status: Within Functional Limits for tasks assessed                                        Exercises      General Comments        Pertinent Vitals/Pain Pain Assessment: 0-10 Pain Score: 5  Pain Location: L knee Pain Descriptors / Indicators: Aching;Sore Pain Intervention(s): Limited activity within patient's tolerance;Monitored during session;Ice applied;Patient requesting pain meds-RN notified    Home Living                      Prior Function            PT Goals (current goals can now be found in the care plan section) Acute Rehab PT Goals Patient Stated Goal: REgain IND PT Goal Formulation: With patient Time For Goal Achievement: 01/14/21 Potential to Achieve Goals: Good Progress towards PT goals: Progressing toward goals    Frequency    7X/week      PT  Plan Current plan remains appropriate    Co-evaluation              AM-PAC PT "6 Clicks" Mobility   Outcome Measure  Help needed turning from your back to your side while in a flat bed without using bedrails?: A Little Help needed moving from lying on your back to sitting on the side of a flat bed without using bedrails?: A Little Help needed moving to and from a bed to a chair (including a wheelchair)?: A Little Help needed standing up from a chair using your arms (e.g., wheelchair or bedside chair)?: A Little Help needed to walk in hospital room?: A Little Help needed climbing  3-5 steps with a railing? : A Little 6 Click Score: 18    End of Session Equipment Utilized During Treatment: Gait belt Activity Tolerance: Patient tolerated treatment well Patient left: in bed;with call bell/phone within reach;with nursing/sitter in room Nurse Communication: Mobility status PT Visit Diagnosis: Difficulty in walking, not elsewhere classified (R26.2)     Time: 3267-1245 PT Time Calculation (min) (ACUTE ONLY): 22 min  Charges:  $Gait Training: 8-22 mins                     [place] Pager [phone] Office [phone]    Felicia Cooper 01/08/2021, 1:36 PM

## 2021-01-08 NOTE — Anesthesia Postprocedure Evaluation (Signed)
Anesthesia Post Note  Patient: Felicia Cooper  Procedure(s) Performed: TOTAL KNEE REVISION, HINGED KNEE (Left Knee)     Patient location during evaluation: PACU Anesthesia Type: Regional, MAC and Spinal Level of consciousness: awake and alert and oriented Pain management: pain level controlled Vital Signs Assessment: post-procedure vital signs reviewed and stable Respiratory status: spontaneous breathing, nonlabored ventilation and respiratory function stable Cardiovascular status: blood pressure returned to baseline and stable Postop Assessment: no headache, no backache, spinal receding and patient able to bend at knees Anesthetic complications: no   No complications documented.  Last Vitals:  Vitals:   01/08/21 0048 01/08/21 0559  BP: (!) 120/56 110/67  Pulse: 78 80  Resp: 16 16  Temp: 36.9 C 36.8 C  SpO2: 97% 93%    Last Pain:  Vitals:   01/08/21 0559  TempSrc: Oral  PainSc:                  Pervis Hocking

## 2021-01-08 NOTE — Progress Notes (Signed)
   Subjective: 1 Day Post-Op Procedure(s) (LRB): TOTAL KNEE REVISION, HINGED KNEE (Left) Patient reports pain as moderate.   Patient seen in rounds for Dr. Alvan Dame. Patient is well, and has had no acute complaints or problems other than pain in the left knee. Reports her pain medications are controlling this well. Denies chest pain, SOB, or calf pain. No issues overnight.  We will continue therapy today, ambulated 70' yesterday.   Objective: Vital signs in last 24 hours: Temp:  [97.7 F (36.5 C)-98.5 F (36.9 C)] 98.2 F (36.8 C) (05/20 0559) Pulse Rate:  [75-93] 80 (05/20 0559) Resp:  [11-17] 16 (05/20 0559) BP: (110-131)/(48-71) 110/67 (05/20 0559) SpO2:  [90 %-100 %] 93 % (05/20 0559)  Intake/Output from previous day:  Intake/Output Summary (Last 24 hours) at 01/08/2021 0737 Last data filed at 01/08/2021 0600 Gross per 24 hour  Intake 3830 ml  Output 2180 ml  Net 1650 ml     Intake/Output this shift: No intake/output data recorded.  Labs: Recent Labs    01/05/21 0852 01/08/21 0320  HGB 14.7 11.2*   Recent Labs    01/05/21 0852 01/08/21 0320  WBC 8.3 13.4*  RBC 4.91 3.68*  HCT 45.2 34.4*  PLT 255 199   Recent Labs    01/05/21 0852 01/08/21 0320  NA 139 140  K 4.0 4.1  CL 102 107  CO2 30 28  BUN 19 20  CREATININE 0.83 0.83  GLUCOSE 114* 134*  CALCIUM 11.3* 8.6*   Recent Labs    01/05/21 0852  INR 0.9    Exam: General - Patient is Alert and Oriented Extremity - Neurologically intact Neurovascular intact Sensation intact distally Dorsiflexion/Plantar flexion intact Dressing - dressing C/D/I Motor Function - intact, moving foot and toes well on exam.   Past Medical History:  Diagnosis Date  . Anxiety   . Arthritis   . Depression   . Fibromyalgia   . GERD (gastroesophageal reflux disease)   . Hammer toe   . Hypothyroidism   . Wears glasses     Assessment/Plan: 1 Day Post-Op Procedure(s) (LRB): TOTAL KNEE REVISION, HINGED KNEE  (Left) Active Problems:   S/P revision of total knee, left  Estimated body mass index is 39.84 kg/m as calculated from the following:   Height as of 01/05/21: 5' (1.524 m).   Weight as of 01/05/21: 92.5 kg. Advance diet Up with therapy D/C IV fluids  DVT Prophylaxis - Aspirin Weight bearing as tolerated. Continue therapy.  Plan is to go Home after hospital stay. Plan for discharge later today after two sessions of physical therapy. Scheduled for OPPT which will not begin until June 1st. Patient will work on HEP until that time. Follow-up in the office in 2 weeks.   Theresa Duty, PA-C Orthopedic Surgery [phone] 01/08/2021, 7:37 AM

## 2021-01-08 NOTE — Progress Notes (Signed)
Physical Therapy Treatment Patient Details Name: Felicia Cooper MRN: [id number] DOB: 1950-09-15 Today's Date: 01/08/2021    History of Present Illness Pt s/p L TKR revision to hinged knee.  Pt with hx of bil TKR and fibromyalgia    PT Comments    Pt very motivated and progressing well with mobility.  Pt up to ambulate increased distance in hall and performed HEP with assist - written instruction provided and reviewed.   Follow Up Recommendations  Follow surgeon's recommendation for DC plan and follow-up therapies     Equipment Recommendations  None recommended by PT    Recommendations for Other Services       Precautions / Restrictions Precautions Precautions: Fall;Knee Restrictions Weight Bearing Restrictions: No LLE Weight Bearing: Weight bearing as tolerated    Mobility  Bed Mobility Overal bed mobility: Needs Assistance Bed Mobility: Supine to Sit     Supine to sit: Supervision          Transfers Overall transfer level: Needs assistance Equipment used: Rolling walker (2 wheeled) Transfers: Sit to/from Stand Sit to Stand: Min guard         General transfer comment: cues for LE management and use of UEs to self assist.  Ambulation/Gait Ambulation/Gait assistance: Min guard Gait Distance (Feet): 170 Feet Assistive device: Rolling walker (2 wheeled) Gait Pattern/deviations: Decreased step length - right;Decreased step length - left;Shuffle;Trunk flexed;Step-to pattern;Step-through pattern     General Gait Details: cues for sequence, posture and position from [employer] Mobility    Modified Rankin (Stroke Patients Only)       Balance Overall balance assessment: Needs assistance Sitting-balance support: No upper extremity supported;Feet supported Sitting balance-Leahy Scale: Good     Standing balance support: No upper extremity supported Standing balance-Leahy Scale: Fair                               Cognition Arousal/Alertness: Awake/alert Behavior During Therapy: WFL for tasks assessed/performed Overall Cognitive Status: Within Functional Limits for tasks assessed                                        Exercises Total Joint Exercises Ankle Circles/Pumps: AROM;Both;15 reps;Supine Quad Sets: AROM;10 reps;Both;Supine Heel Slides: AAROM;Left;15 reps;Supine Hip ABduction/ADduction: AROM;Left;10 reps;Supine Straight Leg Raises: AROM;Left;10 reps;Supine Long Arc Quad: AAROM;AROM;Left;10 reps;Seated Other Exercises Other Exercises: towel squeezes x 10    General Comments        Pertinent Vitals/Pain Pain Assessment: 0-10 Pain Score: 4  Pain Location: L knee Pain Descriptors / Indicators: Aching;Sore Pain Intervention(s): Limited activity within patient's tolerance;Monitored during session;Premedicated before session;Ice applied    Home Living                      Prior Function            PT Goals (current goals can now be found in the care plan section) Acute Rehab PT Goals Patient Stated Goal: REgain IND PT Goal Formulation: With patient Time For Goal Achievement: 01/14/21 Potential to Achieve Goals: Good Progress towards PT goals: Progressing toward goals    Frequency    7X/week      PT Plan Current plan remains appropriate    Co-evaluation  AM-PAC PT "6 Clicks" Mobility   Outcome Measure  Help needed turning from your back to your side while in a flat bed without using bedrails?: A Little Help needed moving from lying on your back to sitting on the side of a flat bed without using bedrails?: A Little Help needed moving to and from a bed to a chair (including a wheelchair)?: A Little Help needed standing up from a chair using your arms (e.g., wheelchair or bedside chair)?: A Little Help needed to walk in hospital room?: A Little Help needed climbing 3-5 steps with a railing? : A Lot 6 Click Score: 17     End of Session Equipment Utilized During Treatment: Gait belt Activity Tolerance: Patient tolerated treatment well Patient left: in chair;with call bell/phone within reach;with chair alarm set;with family/visitor present Nurse Communication: Mobility status PT Visit Diagnosis: Difficulty in walking, not elsewhere classified (R26.2)     Time: 1610-9604 PT Time Calculation (min) (ACUTE ONLY): 39 min  Charges:  $Gait Training: 8-22 mins $Therapeutic Exercise: 23-37 mins                     [place] Pager [phone] Office [phone]    Zacherie Honeyman 01/08/2021, 9:13 AM

## 2021-01-09 NOTE — Brief Op Note (Signed)
01/07/2021  7:43 AM  PATIENT:  Harl Favor  70 y.o. female  PRE-OPERATIVE DIAGNOSIS:  Failed left total knee arthroplasty  POST-OPERATIVE DIAGNOSIS:  Failed left total knee arthroplasty  PROCEDURE:  Procedure(s): TOTAL KNEE REVISION, HINGED KNEE (Left)  SURGEON:  Surgeon(s) and Role:    Paralee Cancel, MD - Primary  PHYSICIAN ASSISTANT: Griffith Citron, PA-C  ANESTHESIA:   regional and spinal  EBL:  30 mL   BLOOD ADMINISTERED:none  DRAINS: none   LOCAL MEDICATIONS USED:  MARCAINE     SPECIMEN:  No Specimen  DISPOSITION OF SPECIMEN:  N/A  COUNTS:  YES  TOURNIQUET:   Total Tourniquet Time Documented: Thigh (Left) - 96 minutes Total: Thigh (Left) - 96 minutes   DICTATION: .Other Dictation: Dictation Number [id number]  PLAN OF CARE: Admit to inpatient   PATIENT DISPOSITION:  PACU - hemodynamically stable.   Delay start of Pharmacological VTE agent (>24hrs) due to surgical blood loss or risk of bleeding: no

## 2021-01-09 NOTE — Op Note (Signed)
NAME: Felicia Cooper, Felicia Cooper MEDICAL RECORD NO: [id number] ACCOUNT NO: [id number] DATE OF BIRTH: 11-Aug-1951 FACILITY: [facility]ATION: [id number] PHYSICIAN: Pietro Cassis. 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  POSTOPERATIVE DIAGNOSES: 1.  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 2.  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 3.  Findings of aseptic loosening of her patellar button as well as the tibial tray.  PROCEDURE:  Revision of left total knee arthroplasty to a hinged knee replacement.  COMPONENTS USED:  DePuy Attune revision knee system with a size 3 Attune revision tray with a 29 porous coated sleeve with a 14 x 60 mm press-fit stem.  We used a 16 mm insert to match the extra small femoral component.  Femoral component was a left extra small rotating hinge component cemented with a 20 mm cemented sleeve and a 13 x 60 mm cemented stem with a 38 patella dome.  SURGEON:  Pietro Cassis. Alvan Dame, MD  ASSISTANT:  Griffith Citron, PA.  Note that Felicia Cooper was present for the entirety of the case for preoperative positioning, perioperative management of the operative extremity, general facilitation of the case, and primary wound closure.  ANESTHESIA:  Regional plus spinal.  SPECIMENS:  None.  COMPLICATIONS:  None apparent.  DRAINS:  None.  TOURNIQUET:  Up for a total of 72 minutes at 250 mmHg.  INDICATIONS FOR PROCEDURE:  The patient is a pleasant 70 year old female with a history of index left total knee replacement approximately 13 years ago.  She had recently presented to the office with concerns for a popping sensation in her knee and since  that time some instability.  Radiographically, she had evidence  of opening of the lateral joint space consistent with concern for lateral collateral ligament injury.  We did try some conservative treatment; however, this did not provide relief.  She had  pain and instability in left knee.  There was no specific isolated trauma to indicate an injury to the lateral collateral ligament.  She had otherwise been doing well with the knee.  We reviewed the risks and indications of the procedure.  I reviewed  her case based on lateral collateral ligament injury with colleagues.  Based on the challenges of reconstructing the lateral collateral ligament and knee replacement, we elected to proceed with revision to a hinged knee.  The risks of infection, DVT  component failure, specifically related to hinged components, were discussed and reviewed.  The postoperative course was reviewed.  Consent was obtained for benefit of pain relief.  DESCRIPTION OF PROCEDURE:  The patient was brought to the operating theater.  Once adequate anesthesia, preoperative antibiotics, Ancef, administered as well as tranexamic acid and Decadron, she was positioned supine with a left thigh tourniquet placed.   Left lower extremity was then prepped and draped in sterile fashion.  A timeout was performed identifying the patient, the planned procedure, and extremity.  Leg was exsanguinated, elevated and tourniquet elevated to 250 mmHg.  Her old incision was  excised.  Soft tissue planes created.  A medial arthrotomy was then made.  When I opened up her knee, I was surprised to find metalosis changes to her synovium.  She had normal clear synovial fluid without signs of infection.  She had obvious  metal-stained synovium.  I performed a complete synovectomy within the medial and lateral aspects of joints as well as superiorly and as we worked through her knee posteriorly.  Once I exposed her knee, we identified significant wear to her polyethylene  consistent with chronic instability.  The posterior medial  aspect of her polyethylene was significantly damaged and eroded, likely leading to posterior medial femoral condyle contact with the medial tibial tray resulting in the metalosis.  Nonetheless,  following initial exposure and this identification, I did evaluate her patella as we were performing scar debridement.  The patellar button was completely loose as it was able to be removed with my thumb nail.  We protected the patella until further  evaluation.  At this point, I removed the old polyethylene and further examined it.  We then used a thin oscillating saw and removed the tibial and femoral components without significant bone loss.  Remaining cement was removed.  This allowed for further  debridement of the synovium in the posterior aspect of the knee, which was carried out carefully.  Once I had the components removed and cement debrided, I refreshened the cut on the proximal tibia.  I then prepared the tibia per protocol for a revision  Attune tray with a 29 press-fit sleeve.  We then placed a trial component in place.  The femur was then prepared for a S-ROM hinged knee per protocol.  We did trial reductions and found that with a 16 mm insert that the knee did hyperextend per  expectations for a hinged knee, but was nonetheless stable in flexion.  Given all these findings, the final components were opened on the back table.  The final components were opened on the back table and configured under direct supervision.  Final  debridement and irrigation of the knee was carried out.  The synovial capsule was injected with 0.25% Marcaine with epinephrine, 1 mL of Toradol, and saline.  Following irrigation and drying the cut surfaces, the prepared tibial component was impacted to  the cut surface as was the trial.  We mixed 2 batches of cement and cemented the femoral component in place.  The knee was then brought to extension with a 16 mm insert.  Once the cement fully cured and excessive cement was removed,  the final 16 mm  extra small polyethylene was inserted and the hinged locking bolt placed.  We let the tourniquet down.  There was no significant hemostasis required.  We irrigated the knee again.  The extensor mechanism was then able to be reapproximated.  Please note that I did redo the patella.  I was able to preserve 12 mm of her bone following removal of all bone and synovium.  We used a 38 patella dome and was able to find new drill holes in the patella.  I did feel it was stable enough to proceed.   The patellar component was also cemented in place.  The extensor mechanism was reapproximated using a combination of #1 Vicryl and #1 Stratafix sutures.  The remainder of the wound was closed with 2-0 Vicryl and a running Monocryl stitch.  The knee was cleaned, dried, and dressed sterilely using surgical  glue and Aquacel dressing.  She was then brought to the recovery room in stable condition tolerating the procedure well.  Postoperatively, she will be weightbearing as tolerated.  We will begin working on range of motion exercises.  Findings reviewed  with her daughter.   [facility] D: 01/09/2021 7:42:17 am T: 01/09/2021 11:31:00 am  JOB: [id number]

## 2021-01-13 NOTE — Telephone Encounter (Signed)
Path to patient 8/31 surgery made

## 2021-01-15 NOTE — Discharge Summary (Signed)
Physician Discharge Summary   Patient ID: Felicia Cooper MRN: [id number] DOB/AGE: 68-Oct-1952 68 y.o.  Admit date: 01/07/2021 Discharge date: 01/08/2021  Primary Diagnosis:  1.  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 2.  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 3.  Findings of aseptic loosening of her patellar button as well as the tibial tray.   Admission Diagnoses:  Past Medical History:  Diagnosis Date  . Anxiety   . Arthritis   . Depression   . Fibromyalgia   . GERD (gastroesophageal reflux disease)   . Hammer toe   . Hypothyroidism   . Wears glasses    Discharge Diagnoses:   Active Problems:   S/P revision of total knee, left  Estimated body mass index is 39.84 kg/m as calculated from the following:   Height as of 01/05/21: 5' (1.524 m).   Weight as of 01/05/21: 92.5 kg.  Procedure:  Procedure(s) (LRB): TOTAL KNEE REVISION, HINGED KNEE (Left)   Consults: None  HPI: The patient is a pleasant 70 year old female with a history of index left total knee replacement approximately 13 years ago.  She had recently presented to the office with concerns for a popping sensation in her knee and since  that time some instability.  Radiographically, she had evidence of opening of the lateral joint space consistent with concern for lateral collateral ligament injury.  We did try some conservative treatment; however, this did not provide relief.  She had  pain and instability in left knee.  There was no specific isolated trauma to indicate an injury to the lateral collateral ligament.  She had otherwise been doing well with the knee.  We reviewed the risks and indications of the procedure.  I reviewed  her case based on lateral collateral ligament injury with colleagues.  Based on the challenges of reconstructing the lateral collateral ligament and  knee replacement, we elected to proceed with revision to a hinged knee.  The risks of infection, DVT  component failure, specifically related to hinged components, were discussed and reviewed.  The postoperative course was reviewed.  Consent was obtained for benefit of pain relief.  Laboratory Data: Admission on 01/07/2021, Discharged on 01/08/2021  Component Date Value Ref Range Status  . WBC 01/08/2021 13.4* 4.0 - 10.5 K/uL Final  . RBC 01/08/2021 3.68* 3.87 - 5.11 MIL/uL Final  . Hemoglobin 01/08/2021 11.2* 12.0 - 15.0 g/dL Final  . HCT 01/08/2021 34.4* 36.0 - 46.0 % Final  . MCV 01/08/2021 93.5  80.0 - 100.0 fL Final  . MCH 01/08/2021 30.4  26.0 - 34.0 pg Final  . MCHC 01/08/2021 32.6  30.0 - 36.0 g/dL Final  . RDW 01/08/2021 12.8  11.5 - 15.5 % Final  . Platelets 01/08/2021 199  150 - 400 K/uL Final  . nRBC 01/08/2021 0.0  0.0 - 0.2 % Final   Performed at [facility], [place] [address], [place], [place] [address]  . Sodium 01/08/2021 140  135 - 145 mmol/L Final  . Potassium 01/08/2021 4.1  3.5 - 5.1 mmol/L Final  . Chloride 01/08/2021 107  98 - 111 mmol/L Final  . CO2 01/08/2021 28  22 - 32 mmol/L Final  . Glucose, Bld 01/08/2021 134* 70 - 99 mg/dL Final   Glucose reference range applies only to samples taken after fasting for at least 8 hours.  . BUN 01/08/2021 20  8 -  23 mg/dL Final  . Creatinine, Ser 01/08/2021 0.83  0.44 - 1.00 mg/dL Final  . Calcium 01/08/2021 8.6* 8.9 - 10.3 mg/dL Final  . GFR, Estimated 01/08/2021 >60  >60 mL/min Final   Comment: (NOTE) Calculated using the CKD-EPI Creatinine Equation (2021)   . Anion gap 01/08/2021 5  5 - 15 Final   Performed at [facility], [place] [address] [place], [place] [address]  Hospital Outpatient Visit on 01/05/2021  Component Date Value Ref Range Status  . SARS Coronavirus 2 01/05/2021 NEGATIVE  NEGATIVE Final   Comment: (NOTE) SARS-CoV-2 target nucleic acids are NOT DETECTED.  The  SARS-CoV-2 RNA is generally detectable in upper and lower respiratory specimens during the acute phase of infection. Negative results do not preclude SARS-CoV-2 infection, do not rule out co-infections with other pathogens, and should not be used as the sole basis for treatment or other patient management decisions. Negative results must be combined with clinical observations, patient history, and epidemiological information. The expected result is Negative.  Fact Sheet for Patients: SugarRoll.be  Fact Sheet for Healthcare Providers: https://www.woods-mathews.com/  This test is not yet approved or cleared by the [place] FDA and  has been authorized for detection and/or diagnosis of SARS-CoV-2 by FDA under an Emergency Use Authorization (EUA). This EUA will remain  in effect (meaning this test can be used) for the duration of the COVID-19 declaration under Se                          ction 564(b)(1) of the Act, 21 U.S.C. section 360bbb-3(b)(1), unless the authorization is terminated or revoked sooner.  Performed at [facility], [place] [address] [place], [place] [address]   Hospital Outpatient Visit on 01/05/2021  Component Date Value Ref Range Status  . WBC 01/05/2021 8.3  4.0 - 10.5 K/uL Final  . RBC 01/05/2021 4.91  3.87 - 5.11 MIL/uL Final  . Hemoglobin 01/05/2021 14.7  12.0 - 15.0 g/dL Final  . HCT 01/05/2021 45.2  36.0 - 46.0 % Final  . MCV 01/05/2021 92.1  80.0 - 100.0 fL Final  . MCH 01/05/2021 29.9  26.0 - 34.0 pg Final  . MCHC 01/05/2021 32.5  30.0 - 36.0 g/dL Final  . RDW 01/05/2021 13.0  11.5 - 15.5 % Final  . Platelets 01/05/2021 255  150 - 400 K/uL Final  . nRBC 01/05/2021 0.0  0.0 - 0.2 % Final   Performed at [facility], [place] [address], [place], [place] [address]  . Sodium 01/05/2021 139  135 - 145 mmol/L Final  . Potassium 01/05/2021 4.0  3.5 - 5.1 mmol/L Final  . Chloride 01/05/2021 102   98 - 111 mmol/L Final  . CO2 01/05/2021 30  22 - 32 mmol/L Final  . Glucose, Bld 01/05/2021 114* 70 - 99 mg/dL Final   Glucose reference range applies only to samples taken after fasting for at least 8 hours.  . BUN 01/05/2021 19  8 - 23 mg/dL Final  . Creatinine, Ser 01/05/2021 0.83  0.44 - 1.00 mg/dL Final  . Calcium 01/05/2021 11.3* 8.9 - 10.3 mg/dL Final  . Total Protein 01/05/2021 7.5  6.5 - 8.1 g/dL Final  . Albumin 01/05/2021 4.3  3.5 - 5.0 g/dL Final  . AST 01/05/2021 18  15 - 41 U/L Final  . ALT 01/05/2021 23  0 - 44 U/L Final  . Alkaline Phosphatase 01/05/2021 73  38 - 126  U/L Final  . Total Bilirubin 01/05/2021 0.5  0.3 - 1.2 mg/dL Final  . GFR, Estimated 01/05/2021 >60  >60 mL/min Final   Comment: (NOTE) Calculated using the CKD-EPI Creatinine Equation (2021)   . Anion gap 01/05/2021 7  5 - 15 Final   Performed at [facility], [place] [address] [place], [place] [address]  . Prothrombin Time 01/05/2021 12.3  11.4 - 15.2 seconds Final  . INR 01/05/2021 0.9  0.8 - 1.2 Final   Comment: (NOTE) INR goal varies based on device and disease states. Performed at [facility], [place] [address], [place], [place] [address]   . aPTT 01/05/2021 25  24 - 36 seconds Final   Performed at [facility], [place] [address], [place], [place] [address]  . ABO/RH(D) 01/05/2021 A POS   Final  . Antibody Screen 01/05/2021 NEG   Final  . Sample Expiration 01/05/2021 01/10/2021,2359   Final  . Extend sample reason 01/05/2021    Final                   Value:NO TRANSFUSIONS OR PREGNANCY IN THE PAST 3 MONTHS Performed at [place] [address] [place], [place] [address]   . MRSA, PCR 01/05/2021 NEGATIVE  NEGATIVE Final  . Staphylococcus aureus 01/05/2021 NEGATIVE  NEGATIVE Final   Comment: (NOTE) The Xpert SA Assay (FDA approved for NASAL specimens in patients 35 years of age and older), is one component of a  comprehensive surveillance program. It is not intended to diagnose infection nor to guide or monitor treatment. Performed at [facility], [place] [address], [place], [place] [address]      X-Rays:No results found.  EKG:No orders found for this or any previous visit.   Hospital Course: ARLISSA MONTEVERDE is a 70 y.o. who was admitted to [facility]. They were brought to the operating room on 01/07/2021 and underwent Procedure(s): TOTAL KNEE REVISION, HINGED KNEE.  Patient tolerated the procedure well and was later transferred to the recovery room and then to the orthopaedic floor for postoperative care. They were given PO and IV analgesics for pain control following their surgery. They were given 24 hours of postoperative antibiotics of  Anti-infectives (From admission, onward)   Start     Dose/Rate Route Frequency Ordered Stop   01/08/21 1000  fluconazole (DIFLUCAN) tablet 150 mg        150 mg Oral  Once 01/08/21 0845 01/08/21 0947   01/07/21 1600  ceFAZolin (ANCEF) IVPB 2g/100 mL premix        2 g 200 mL/hr over 30 Minutes Intravenous Every 6 hours 01/07/21 1404 01/08/21 0730   01/07/21 0615  ceFAZolin (ANCEF) IVPB 2g/100 mL premix        2 g 200 mL/hr over 30 Minutes Intravenous On call to O.R. 01/07/21 [id number] 01/07/21 0940     and started on DVT prophylaxis in the form of Aspirin.   PT and OT were ordered for total joint protocol. Discharge planning consulted to help with postop disposition and equipment needs.  Patient had a good night on the evening of surgery. They started to get up OOB with therapy on POD #0. Pt was seen during rounds and was ready to go home pending progress with therapy.She worked with therapy on POD #1 and was meeting her goals. Pt was discharged to home later that day in stable condition.  Diet: Regular diet Activity: WBAT Follow-up: in  2 weeks Disposition: Home Discharged Condition: good   Discharge Instructions    Call MD / Call 911    Complete by: As directed    If you experience chest pain or shortness of breath, CALL 911 and be transported to the hospital emergency room.  If you develope a fever above 101 F, pus (white drainage) or increased drainage or redness at the wound, or calf pain, call your surgeon's office.   Change dressing   Complete by: As directed    Maintain surgical dressing until follow up in the clinic. If the edges start to pull up, may reinforce with tape. If the dressing is no longer working, may remove and cover with gauze and tape, but must keep the area dry and clean.  Call with any questions or concerns.   Constipation Prevention   Complete by: As directed    Drink plenty of fluids.  Prune juice may be helpful.  You may use a stool softener, such as Colace (over the counter) 100 mg twice a day.  Use MiraLax (over the counter) for constipation as needed.   Diet - low sodium heart healthy   Complete by: As directed    Increase activity slowly as tolerated   Complete by: As directed    Weight bearing as tolerated with assist device (walker, cane, etc) as directed, use it as long as suggested by your surgeon or therapist, typically at least 4-6 weeks.   Post-operative opioid taper instructions:   Complete by: As directed    POST-OPERATIVE OPIOID TAPER INSTRUCTIONS: It is important to wean off of your opioid medication as soon as possible. If you do not need pain medication after your surgery it is ok to stop day one. Opioids include: Codeine, Hydrocodone(Norco, Vicodin), Oxycodone(Percocet, oxycontin) and hydromorphone amongst others.  Long term and even short term use of opiods can cause: Increased pain response Dependence Constipation Depression Respiratory depression And more.  Withdrawal symptoms can include Flu like symptoms Nausea, vomiting And more Techniques to manage these symptoms Hydrate well Eat regular healthy meals Stay active Use relaxation techniques(deep breathing,  meditating, yoga) Do Not substitute Alcohol to help with tapering If you have been on opioids for less than two weeks and do not have pain than it is ok to stop all together.  Plan to wean off of opioids This plan should start within one week post op of your joint replacement. Maintain the same interval or time between taking each dose and first decrease the dose.  Cut the total daily intake of opioids by one tablet each day Next start to increase the time between doses. The last dose that should be eliminated is the evening dose.      TED hose   Complete by: As directed    Use stockings (TED hose) for 2 weeks on both leg(s).  You may remove them at night for sleeping.     Allergies as of 01/08/2021      Reactions   Oxycodone Itching      Medication List    STOP taking these medications   ibuprofen 200 MG tablet Commonly known as: ADVIL     TAKE these medications   aspirin 81 MG chewable tablet Chew 1 tablet (81 mg total) by mouth 2 (two) times daily for 28 days.   buPROPion 150 MG 12 hr tablet Commonly known as: WELLBUTRIN SR Take 150 mg by mouth 2 (two) times daily.   celecoxib 200 MG capsule Commonly known as: CELEBREX  Take 1 capsule (200 mg total) by mouth 2 (two) times daily.   docusate sodium 100 MG capsule Commonly known as: COLACE Take 1 capsule (100 mg total) by mouth 2 (two) times daily.   fexofenadine 180 MG tablet Commonly known as: ALLEGRA Take 180 mg by mouth daily.   gabapentin 300 MG capsule Commonly known as: NEURONTIN Take 300 mg by mouth daily.   HYDROcodone-acetaminophen 5-325 MG tablet Commonly known as: NORCO/VICODIN Take 1-2 tablets by mouth every 4 (four) hours as needed.   levothyroxine 88 MCG tablet Commonly known as: SYNTHROID Take 88 mcg by mouth daily before breakfast.   methocarbamol 500 MG tablet Commonly known as: ROBAXIN Take 1 tablet (500 mg total) by mouth every 6 (six) hours as needed for muscle spasms.   PARoxetine 40  MG tablet Commonly known as: PAXIL Take 40 mg by mouth daily.   polyethylene glycol 17 g packet Commonly known as: MIRALAX / GLYCOLAX Take 17 g by mouth daily as needed for mild constipation.   vitamin C 250 MG tablet Commonly known as: ASCORBIC ACID Take 250 mg by mouth daily.            Discharge Care Instructions  (From admission, onward)         Start     Ordered   01/08/21 0000  Change dressing       Comments: Maintain surgical dressing until follow up in the clinic. If the edges start to pull up, may reinforce with tape. If the dressing is no longer working, may remove and cover with gauze and tape, but must keep the area dry and clean.  Call with any questions or concerns.   01/08/21 0739          Follow-up Information    Paralee Cancel, MD. Schedule an appointment as soon as possible for a visit in 2 weeks.   Specialty: Orthopedic Surgery Contact information: [address] [place] [address] [phone]               Signed: Griffith Citron, PA-C Orthopedic Surgery 01/15/2021, 8:20 AM

## 2021-01-20 ENCOUNTER — Ambulatory Visit: Payer: Medicare Other | Admitting: Physician Assistant

## 2021-03-11 ENCOUNTER — Encounter: Payer: Self-pay | Admitting: Podiatry

## 2021-03-11 ENCOUNTER — Other Ambulatory Visit: Payer: Self-pay

## 2021-03-11 ENCOUNTER — Ambulatory Visit (INDEPENDENT_AMBULATORY_CARE_PROVIDER_SITE_OTHER): Payer: Medicare Other | Admitting: Podiatry

## 2021-03-11 ENCOUNTER — Ambulatory Visit (INDEPENDENT_AMBULATORY_CARE_PROVIDER_SITE_OTHER): Payer: Medicare Other

## 2021-03-11 DIAGNOSIS — M778 Other enthesopathies, not elsewhere classified: Secondary | ICD-10-CM

## 2021-03-11 DIAGNOSIS — M205X1 Other deformities of toe(s) (acquired), right foot: Secondary | ICD-10-CM | POA: Diagnosis not present

## 2021-03-11 DIAGNOSIS — M79674 Pain in right toe(s): Secondary | ICD-10-CM

## 2021-03-11 MED ORDER — TRIAMCINOLONE ACETONIDE 10 MG/ML IJ SUSP
10.0000 mg | Freq: Once | INTRAMUSCULAR | Status: AC
  Administered 2021-03-11: 10 mg

## 2021-03-15 NOTE — Progress Notes (Signed)
Subjective:   Patient ID: Felicia Cooper, female   DOB: 70 y.o.   MRN: [id number]   HPI Patient presents stating she is developed quite a bit of pain around her right big toe joint and it is making it hard for her to walk or be active.  Does not remember specific injury   Review of Systems  All other systems reviewed and are negative.      Objective:  Physical Exam Vitals and nursing note reviewed.  Constitutional:      Appearance: She is well-developed.  Pulmonary:     Effort: Pulmonary effort is normal.  Musculoskeletal:        General: Normal range of motion.  Skin:    General: Skin is warm.  Neurological:     Mental Status: She is alert.    Neurovascular status intact muscle strength adequate range of motion adequate with patient found to have discomfort inflammation with swelling fluid around the first MPJ right with moderate depression of the arch and also limited range of motion with no crepitus of the joint surface.  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  I went ahead we will get a try conservative and she will make shoe gear modifications along with doing home physical therapy the and I did sterile prep and injected around the first MPJ 3 mg dexamethasone Kenalog 5 mg Xylocaine and gave instructions on exercise and activity.  Reappoint to recheck  X-rays indicate there is signs of hallux limitus with narrowing of the joint surface for formation around first MPJ right

## 2021-03-30 ENCOUNTER — Ambulatory Visit (INDEPENDENT_AMBULATORY_CARE_PROVIDER_SITE_OTHER): Payer: Medicare Other | Admitting: Physician Assistant

## 2021-03-30 ENCOUNTER — Other Ambulatory Visit: Payer: Self-pay

## 2021-03-30 ENCOUNTER — Encounter: Payer: Self-pay | Admitting: Physician Assistant

## 2021-03-30 DIAGNOSIS — D0462 Carcinoma in situ of skin of left upper limb, including shoulder: Secondary | ICD-10-CM | POA: Diagnosis not present

## 2021-03-30 NOTE — Patient Instructions (Signed)

## 2021-03-31 NOTE — Progress Notes (Signed)
   Follow-Up Visit   Subjective  Felicia Cooper is a 70 y.o. female who presents for the following: Procedure (Scc x1 ).   The following portions of the chart were reviewed this encounter and updated as appropriate:  Tobacco  Allergies  Meds  Problems  Med Hx  Surg Hx  Fam Hx      Objective  Well appearing patient in no apparent distress; mood and affect are within normal limits.  A focused examination was performed including right forearm. Relevant physical exam findings are noted in the Assessment and Plan.  Right Forearm - Posterior Scaly pink papule or plaque.    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 Right Forearm - Posterior  Destruction of lesion Complexity: simple   Destruction method: electrodesiccation and curettage   Informed consent: discussed and consent obtained   Timeout:  patient name, date of birth, surgical site, and procedure verified Anesthesia: the lesion was anesthetized in a standard fashion   Anesthetic:  1% lidocaine w/ epinephrine 1-100,000 local infiltration Curettage performed in three different directions: Yes   Curettage cycles:  3 Final wound size (cm):  1 Hemostasis achieved with:  ferric subsulfate Outcome: patient tolerated procedure well with no complications   Additional details:  Wound innoculated with 5 fluorouracil solution.    I, Novelle Addair, PA-C, have reviewed all documentation's for this visit.  The documentation on 03/31/21 for the exam, diagnosis, procedures and orders are all accurate and complete.

## 2021-04-05 ENCOUNTER — Encounter: Payer: Self-pay | Admitting: Podiatry

## 2021-04-05 ENCOUNTER — Other Ambulatory Visit: Payer: Self-pay

## 2021-04-05 ENCOUNTER — Ambulatory Visit (INDEPENDENT_AMBULATORY_CARE_PROVIDER_SITE_OTHER): Payer: Medicare Other | Admitting: Podiatry

## 2021-04-05 DIAGNOSIS — M778 Other enthesopathies, not elsewhere classified: Secondary | ICD-10-CM | POA: Diagnosis not present

## 2021-04-05 DIAGNOSIS — M205X1 Other deformities of toe(s) (acquired), right foot: Secondary | ICD-10-CM | POA: Diagnosis not present

## 2021-04-05 NOTE — Progress Notes (Signed)
Subjective:   Patient ID: Felicia Cooper, female   DOB: 70 y.o.   MRN: [id number]   HPI Patient presents stating it is improving with pain still if I am too active or if I wear the wrong shoes   ROS      Objective:  Physical Exam  Alert status intact with inflammation around the lateral side first MPJ improved mild swelling with some limitation of motion upon dorsi flexion     Assessment:  Overall doing well with inflammatory of a mild nature still noted     Plan:  H&P reviewed condition recommended the continuation of range of motion exercises topical medicines and rigid bottom shoes.  If symptoms persist may need to consider more aggressive pattern or if they come back periodically we can use medication

## 2021-04-20 ENCOUNTER — Other Ambulatory Visit: Payer: Self-pay | Admitting: Family Medicine

## 2021-04-20 ENCOUNTER — Ambulatory Visit
Admission: RE | Admit: 2021-04-20 | Discharge: 2021-04-20 | Disposition: A | Discharge status: Home / Self Care | Payer: Medicare Other | Source: Ambulatory Visit | Attending: Family Medicine | Admitting: Family Medicine

## 2021-04-20 DIAGNOSIS — M542 Cervicalgia: Secondary | ICD-10-CM

## 2021-04-21 ENCOUNTER — Encounter: Payer: Medicare Other | Admitting: Physician Assistant

## 2021-06-03 ENCOUNTER — Other Ambulatory Visit: Payer: Self-pay | Admitting: Family Medicine

## 2021-06-03 DIAGNOSIS — M542 Cervicalgia: Secondary | ICD-10-CM

## 2021-06-18 ENCOUNTER — Other Ambulatory Visit: Payer: Self-pay

## 2021-06-18 ENCOUNTER — Ambulatory Visit
Admission: RE | Admit: 2021-06-18 | Discharge: 2021-06-18 | Disposition: A | Discharge status: Home / Self Care | Payer: Medicare Other | Source: Ambulatory Visit | Attending: Family Medicine | Admitting: Family Medicine

## 2021-06-18 DIAGNOSIS — M542 Cervicalgia: Secondary | ICD-10-CM

## 2021-07-07 ENCOUNTER — Encounter: Payer: Self-pay | Admitting: Physician Assistant

## 2021-07-07 ENCOUNTER — Ambulatory Visit (INDEPENDENT_AMBULATORY_CARE_PROVIDER_SITE_OTHER): Payer: Medicare Other | Admitting: Physician Assistant

## 2021-07-07 ENCOUNTER — Other Ambulatory Visit: Payer: Self-pay

## 2021-07-07 DIAGNOSIS — Z808 Family history of malignant neoplasm of other organs or systems: Secondary | ICD-10-CM | POA: Diagnosis not present

## 2021-07-07 DIAGNOSIS — Z85828 Personal history of other malignant neoplasm of skin: Secondary | ICD-10-CM

## 2021-07-07 DIAGNOSIS — Z86007 Personal history of in-situ neoplasm of skin: Secondary | ICD-10-CM | POA: Diagnosis not present

## 2021-07-07 NOTE — Progress Notes (Signed)
   Follow-Up Visit   Subjective  Felicia Cooper is a 70 y.o. female who presents for the following: Follow-up (Patient here today for follow up on CIS treatment on right forearm. Per patient healing well no new concerns. Personal history of non mole skin cancer. Family history of melanoma. ).   The following portions of the chart were reviewed this encounter and updated as appropriate:  Tobacco  Allergies  Meds  Problems  Med Hx  Surg Hx  Fam Hx      Objective  Well appearing patient in no apparent distress; mood and affect are within normal limits.  A focused examination was performed including right forearm. Relevant physical exam findings are noted in the Assessment and Plan.  Right Forearm - Posterior Slightly pink macule. Scar clear.   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    I, Lorry Anastasi, PA-C, have reviewed all documentation's for this visit.  The documentation on 07/07/21 for the exam, diagnosis, procedures and orders are all accurate and complete.

## 2021-07-22 DIAGNOSIS — M5412 Radiculopathy, cervical region: Secondary | ICD-10-CM | POA: Insufficient documentation

## 2021-11-08 ENCOUNTER — Encounter: Payer: Self-pay | Admitting: Neurology

## 2021-11-11 NOTE — Progress Notes (Signed)
?Assessment/Plan:  ? ?1.  Tremor. ? -Reassured patient that she does not have CBGD.  This would really was the biggest worry, as her mom suffered from this.  Similarly, reassured her that she has no evidence of Parkinson's disease today. ? -I have a feeling that her tremor could be related to Paxil.  Her Paxil dose was increased about a year ago.  She really is not very tremulous at all in the office, and it does not look like essential tremor.  Even if it was, technically, 1 has to have tremor for 3 years before essential tremor can be diagnosed. ? -Discussed with patient that because her symptoms are so mild, I really would not recommend any treatment right now.  She does not disagree.  Certainly, if symptoms were to change or new symptoms were to develop, then we could readdress this.  For now, she will follow-up on an as-needed basis. ?2.  Hyperreflexia ? -Likely related to cervical spinal stenosis.  We reviewed her MRI today.  She is already seeing orthopedics regarding this. ? ?Subjective:  ? ?Felicia Cooper was seen in consultation in the movement disorder clinic at the request of Kathyrn Lass, MD. medical records made available to me are reviewed.  Patient is a 71 year old female with a history of chronic neck pain (seeing orthopedics for injections), fibromyalgia, depression, hypothyroidism, hyperlipidemia who presents for what is described as "slight tremor to both hands when sitting and writing."  Tremor started approximately several months ago.  She noticed it started when she started on prednisone for HA but it didn't go away when she stopped that and involves the bilateral UE.  Tremor is most noticeable when writing and "just holding it out."   There is no family hx of tremor.  Mom had CBGD and pt worries b/c of that. ? ?Affected by caffeine:  No. (pt drinks 3 diet cokes/day) ?Affected by alcohol:  unknown (1 beer/month) ?Affected by stress:  unknown - has had several deaths in the last few  months ?Affected by fatigue:  No. ?Spills soup if on spoon:  No. ?Spills glass of liquid if full:  No. ?Affects ADL's (tying shoes, brushing teeth, etc):  No. ? ?Current/Previously tried tremor medications: none ? ?Current medications that may exacerbate tremor:  n/a ? ?Outside reports reviewed: historical medical records, lab reports, and referral letter/letters.  Patient had MRI of the brain back in 2016 that was normal.  I personally reviewed those films. ? ?Allergies  ?Allergen Reactions  ? Oxycodone Itching  ? ? ?Current Meds  ?Medication Sig  ? gabapentin (NEURONTIN) 300 MG capsule Take 300 mg by mouth daily.  ? levothyroxine (SYNTHROID) 88 MCG tablet Take 88 mcg by mouth daily before breakfast.  ? meloxicam (MOBIC) 7.5 MG tablet Take 7.5 mg by mouth 2 (two) times daily.  ? PARoxetine (PAXIL) 40 MG tablet Take 40 mg by mouth daily.  ? ? ? ? ?Objective:  ? ?VITALS:   ?Vitals:  ? 11/15/21 1019  ?BP: (!) 154/84  ?Pulse: 96  ?Resp: 18  ?SpO2: 96%  ?Weight: 207 lb (93.9 kg)  ?Height: 5' (1.524 m)  ? ?Gen:  Appears stated age and in NAD. ?HEENT:  Normocephalic, atraumatic. The mucous membranes are moist. The superficial temporal arteries are without ropiness or tenderness. ?Cardiovascular: Regular rate and rhythm. ?Lungs: Clear to auscultation bilaterally. ?Neck: There are no carotid bruits noted bilaterally. ? ?NEUROLOGICAL: ? ?Orientation:  The patient is alert and oriented x 3.   ?Cranial nerves:  There is good facial symmetry. Extraocular muscles are intact and visual fields are full to confrontational testing. Speech is fluent and clear. Soft palate rises symmetrically and there is no tongue deviation. Hearing is intact to conversational tone. ?Tone: Tone is good throughout. ?Sensation: Sensation is intact to light touch touch throughout (facial, trunk, extremities). Vibration is intact at the bilateral big toe. There is no extinction with double simultaneous stimulation. There is no sensory dermatomal level  identified. ?Coordination:  The patient has no dysdiadichokinesia or dysmetria. ?Motor: Strength is 5/5 in the bilateral upper and lower extremities.  Shoulder shrug is equal bilaterally.  There is no pronator drift.  There are no fasciculations noted. ?DTR's: Deep tendon reflexes are 3/4 at the bilateral biceps, triceps, brachioradialis, patella and achilles.  Plantar responses are downgoing bilaterally. ?Gait and Station: The patient is able to ambulate without difficulty.  ? ?MOVEMENT EXAM: ?Tremor:  There is no rest tremor, even with distraction procedures.  There is almost no postural tremor.  There is very minimal intention tremor.  She really has no trouble with Archimedes spirals and has very little trouble pouring water from 1 glass to another. ? I have reviewed and interpreted the following labs independently ?  Chemistry   ?   ?Component Value Date/Time  ? NA 140 01/08/2021 0320  ? K 4.1 01/08/2021 0320  ? CL 107 01/08/2021 0320  ? CO2 28 01/08/2021 0320  ? BUN 20 01/08/2021 0320  ? CREATININE 0.83 01/08/2021 0320  ?    ?Component Value Date/Time  ? CALCIUM 8.6 (L) 01/08/2021 0320  ? ALKPHOS 73 01/05/2021 0852  ? AST 18 01/05/2021 0852  ? ALT 23 01/05/2021 0852  ? BILITOT 0.5 01/05/2021 0852  ?  ? ? ?Lab Results  ?Component Value Date  ? WBC 13.4 (H) 01/08/2021  ? HGB 11.2 (L) 01/08/2021  ? HCT 34.4 (L) 01/08/2021  ? MCV 93.5 01/08/2021  ? PLT 199 01/08/2021  ? ?No results found for: TSH ? ?  ? ? ?CC:  Kathyrn Lass, MD ? ? ?

## 2021-11-15 ENCOUNTER — Other Ambulatory Visit: Payer: Self-pay

## 2021-11-15 ENCOUNTER — Ambulatory Visit (INDEPENDENT_AMBULATORY_CARE_PROVIDER_SITE_OTHER): Payer: Medicare Other | Admitting: Neurology

## 2021-11-15 ENCOUNTER — Encounter: Payer: Self-pay | Admitting: Neurology

## 2021-11-15 VITALS — BP 154/84 | HR 96 | Resp 18 | Ht 60.0 in | Wt 207.0 lb

## 2021-11-15 DIAGNOSIS — G251 Drug-induced tremor: Secondary | ICD-10-CM

## 2021-11-15 DIAGNOSIS — M4802 Spinal stenosis, cervical region: Secondary | ICD-10-CM

## 2021-11-15 NOTE — Patient Instructions (Signed)
We discussed that you have tremor, that may be worsened by Paxil.  At this point, I would not take any additional medication for the tremor.   ? ?The physicians and staff at [facility] are committed to providing excellent care. You may receive a survey requesting feedback about your experience at our office. We strive to receive "very good" responses to the survey questions. If you feel that your experience would prevent you from giving the office a "very good " response, please contact our office to try to remedy the situation. We may be reached at [phone]. Thank you for taking the time out of your busy day to complete the survey. ? ?

## 2021-11-25 ENCOUNTER — Ambulatory Visit (INDEPENDENT_AMBULATORY_CARE_PROVIDER_SITE_OTHER): Payer: Medicare Other | Admitting: Podiatry

## 2021-11-25 ENCOUNTER — Encounter: Payer: Self-pay | Admitting: Podiatry

## 2021-11-25 ENCOUNTER — Ambulatory Visit (INDEPENDENT_AMBULATORY_CARE_PROVIDER_SITE_OTHER): Payer: Medicare Other

## 2021-11-25 DIAGNOSIS — M205X1 Other deformities of toe(s) (acquired), right foot: Secondary | ICD-10-CM | POA: Diagnosis not present

## 2021-11-25 DIAGNOSIS — M21621 Bunionette of right foot: Secondary | ICD-10-CM

## 2021-11-25 DIAGNOSIS — M778 Other enthesopathies, not elsewhere classified: Secondary | ICD-10-CM

## 2021-11-25 NOTE — Progress Notes (Signed)
Subjective:  ? ?Patient ID: Felicia Cooper, female   DOB: 71 y.o.   MRN: [id number]  ? ?HPI ?Patient presents stating that she has been getting a lot of pain in her big toe joint it did not respond to medication and she wants to get it fixed and she is having a lot of pain in the outside of her right foot ? ? ?ROS ? ? ?   ?Objective:  ?Physical Exam  ?Neuro vascular status intact with significant loss of motion first MPJ right with exquisite discomfort around the joint surface and crepitus and a prominent fifth metatarsal head right with redness and pain ? ?   ?Assessment:  ?Hallux limitus rigidus condition right that did not respond to injection treatment along with tailor's bunion deformity right ? ?   ?Plan:  ?H&P reviewed condition and x-ray and at this point discussed surgical intervention with implant procedure and metatarsal head resection.  I did explain I cannot know everything until I can look at the joint and appears to have some cyst in the first metatarsal head but I do not think this should keep [place] from being able to do the implant.  Patient at this point was given consent form read alternative treatments complications and after reading signed and after review we reviewed it.  Dispensed air fracture walker for the postoperative.  I want her getting used to it prior to surgery and I educated her on utilization of air fracture walker understands total recovery can take approximately 6 months and there is no long-term guarantees.  Signed consent form ?   ? ? ?

## 2021-12-13 MED ORDER — HYDROCODONE-ACETAMINOPHEN 10-325 MG PO TABS: 1.0000 | ORAL_TABLET | Freq: Three times a day (TID) | ORAL | 0 refills | Status: AC | PRN

## 2021-12-13 NOTE — Addendum Note (Signed)
Addended by: Wallene Huh on: 12/13/2021 01:45 PM ? ? Modules accepted: Orders ? ?

## 2021-12-14 ENCOUNTER — Encounter: Payer: Self-pay | Admitting: Podiatry

## 2021-12-14 DIAGNOSIS — M2011 Hallux valgus (acquired), right foot: Secondary | ICD-10-CM | POA: Diagnosis not present

## 2021-12-14 DIAGNOSIS — M2021 Hallux rigidus, right foot: Secondary | ICD-10-CM | POA: Diagnosis not present

## 2021-12-20 ENCOUNTER — Ambulatory Visit (INDEPENDENT_AMBULATORY_CARE_PROVIDER_SITE_OTHER): Payer: Medicare Other

## 2021-12-20 ENCOUNTER — Ambulatory Visit (INDEPENDENT_AMBULATORY_CARE_PROVIDER_SITE_OTHER): Payer: Medicare Other | Admitting: Podiatry

## 2021-12-20 ENCOUNTER — Encounter: Payer: Self-pay | Admitting: Podiatry

## 2021-12-20 DIAGNOSIS — Z9889 Other specified postprocedural states: Secondary | ICD-10-CM | POA: Diagnosis not present

## 2021-12-20 DIAGNOSIS — M2011 Hallux valgus (acquired), right foot: Secondary | ICD-10-CM | POA: Diagnosis not present

## 2021-12-20 NOTE — Progress Notes (Signed)
G

## 2021-12-21 NOTE — Progress Notes (Signed)
Subjective:  ? ?Patient ID: Felicia Cooper, female   DOB: 71 y.o.   MRN: [id number]  ? ?HPI ?Patient states doing very well with surgery minimal discomfort and able to walk in her boot without any pain ? ? ?ROS ? ? ?   ?Objective:  ?Physical Exam  ?Neurovascular status intact negative Bevelyn Buckles' sign noted wound edges healed well.  Clinically everything looks excellent with good position of the big toe good range of motion satisfactory incision sites no drainage noted wound edges well coapted ? ?   ?Assessment:  ?Doing well post implant procedure right first metatarsal fifth metatarsal head resection right ? ?   ?Plan:  ?Reviewed x-rays and the hallux is somewhat up on the view but clinically it looks excellent and functioning well so I am hoping that this will not be any kind of issue for her.  I did have her lower the big toe with her dressing and we will keep that for the next several weeks continue elevation compression and we will rex-ray again in several weeks ? ?X-rays indicate the hallux is elevated but I do think overall the position should work for her and I am hoping this will not give her a problem and if it does ultimately fusion may be necessary.  Fifth metatarsal head resection looks good ?   ? ? ?

## 2021-12-23 ENCOUNTER — Ambulatory Visit: Payer: Medicare Other | Admitting: Physician Assistant

## 2022-01-03 ENCOUNTER — Encounter: Payer: Self-pay | Admitting: Podiatry

## 2022-01-03 ENCOUNTER — Ambulatory Visit (INDEPENDENT_AMBULATORY_CARE_PROVIDER_SITE_OTHER): Payer: Medicare Other

## 2022-01-03 ENCOUNTER — Ambulatory Visit (INDEPENDENT_AMBULATORY_CARE_PROVIDER_SITE_OTHER): Payer: Medicare Other | Admitting: Podiatry

## 2022-01-03 DIAGNOSIS — M2011 Hallux valgus (acquired), right foot: Secondary | ICD-10-CM | POA: Diagnosis not present

## 2022-01-03 DIAGNOSIS — Z9889 Other specified postprocedural states: Secondary | ICD-10-CM | POA: Diagnosis not present

## 2022-01-03 MED ORDER — FLUCONAZOLE 150 MG PO TABS: 150.0000 mg | ORAL_TABLET | Freq: Once | ORAL | 0 refills | Status: AC

## 2022-01-03 MED ORDER — MINOCYCLINE HCL 100 MG PO CAPS: 100.0000 mg | ORAL_CAPSULE | Freq: Two times a day (BID) | ORAL | 1 refills | Status: DC

## 2022-01-05 NOTE — Progress Notes (Signed)
Subjective:  ? ?Patient ID: Felicia Cooper, female   DOB: 71 y.o.   MRN: [id number]  ? ?HPI ?Patient presents stating that even though clinically her big toe is not lifting she is concerned about it and does still have quite a bit of swelling around the first metatarsal and also some slight redness around the fifth metatarsal with no active drainage noted.  Patient has no systemic signs of infection normal temperature no proximal edema erythema drainage noted ? ? ?ROS ? ? ?   ?Objective:  ?Physical Exam  ?Surgical sites are healing well wound edges well coapted clinically the hallux is not lifting but she still has quite a bit of plantar swelling and when she stands it does pop up to a degree and patient also has slight redness around the fifth metatarsal where head resection was performed no active drainage was noted no proximal edema erythema drainage noted ? ?   ?Assessment:  ?Patient does have some instability around the big toe joint which can occur with the bone that has to be cut out in order to take out the arthritic condition present and has mild redness around the fifth MPJ right no indications currently of infective process ? ?   ?Plan:  ?H&P x-ray reviewed organ to continue to monitor the first MPJ and it is possible at 1 point fusion may require fusion if it were to get worse but I am very hopeful clinically this will function well as the swelling goes down and hopefully will not cause her any clinical symptoms even though there is quite a bit of lifting on the x-ray of the hallux.  The fifth MPJ looks normal currently.  I did go ahead because of the slight redness I put her on doxycycline to be safe 100 mg twice daily and I want her to continue to plantarflex the right hallux as the swelling reduces and I am hoping it will come down over time ? ?X-rays indicate the hallux is significantly elevated the implant is in place and again I am hoping clinically this will not be any issue but will have to be  monitored everything else looks normal currently ?   ? ? ?

## 2022-01-24 ENCOUNTER — Encounter: Payer: Medicare Other | Admitting: Podiatry

## 2022-01-25 ENCOUNTER — Telehealth: Payer: Self-pay | Admitting: *Deleted

## 2022-01-25 NOTE — Telephone Encounter (Signed)
Patient is calling because she missed her 6 weeks post surgery appointment due to no lights, wanted to know if she should continue to wear her shoe or is it ok to take off. Please advise.

## 2022-01-26 NOTE — Telephone Encounter (Signed)
Fine to take off and start tennis shoes

## 2022-01-26 NOTE — Telephone Encounter (Signed)
Patient has been notified thru voice message

## 2022-02-04 ENCOUNTER — Encounter: Payer: Self-pay | Admitting: Podiatry

## 2022-02-04 ENCOUNTER — Ambulatory Visit (INDEPENDENT_AMBULATORY_CARE_PROVIDER_SITE_OTHER): Payer: Medicare Other

## 2022-02-04 ENCOUNTER — Ambulatory Visit (INDEPENDENT_AMBULATORY_CARE_PROVIDER_SITE_OTHER): Payer: Medicare Other | Admitting: Podiatry

## 2022-02-04 DIAGNOSIS — M7671 Peroneal tendinitis, right leg: Secondary | ICD-10-CM | POA: Diagnosis not present

## 2022-02-04 DIAGNOSIS — M205X1 Other deformities of toe(s) (acquired), right foot: Secondary | ICD-10-CM

## 2022-02-04 MED ORDER — TRIAMCINOLONE ACETONIDE 10 MG/ML IJ SUSP
10.0000 mg | Freq: Once | INTRAMUSCULAR | Status: AC
  Administered 2022-02-04: 10 mg

## 2022-02-04 NOTE — Progress Notes (Signed)
Subjective:   Patient ID: Felicia Cooper, female   DOB: 71 y.o.   MRN: [id number]   HPI Patient presents stating that she is really not having a lot of pain around the big toe joint but it still sticks up when she stands all when she is sitting and she does have some discomfort on the outside of the right foot which has intensified over the last few weeks.  States the incisions are healing fine no drainage   ROS      Objective:  Physical Exam  Neurovascular status intact with patient who does have continued swelling around the first MPJ left and does have upon weightbearing lifting of the left big toe from implant procedure but no current discomfort with quite a bit of pain in the peroneal insertion base of fifth metatarsal right     Assessment:  Instability of the first MPJ left after implant that so far as not causing pain and I am hoping as the swelling goes down will not be an issue with what appears to be acute peroneal tendinitis right     Plan:  H&P x-rays reviewed went ahead today discussed injection and chances for rupture and she wants injection understanding risk and I did sterile prep and injected around the base of the fifth metatarsal 3 mg dexamethasone Kenalog 5 mg Xylocaine to reduce inflammation along with ice.  Discussed first MPJ hopefully will not need to do anything but if so would require fusion with probable bone graft  X-rays indicate there is some lifting of the hallux left the implant is in place no other pathology noted

## 2022-02-16 IMAGING — MR MR CERVICAL SPINE W/O CM
4 of 5 series · 22 of 48 positions shown · non-contrast
Comparison: Cervical spine radiographs 04/20/2021. Cervical spine
MRI 10/26/2007.
COMPARISON: Cervical spine radiographs 04/20/2021. Cervical spine
MRI 10/26/2007.

Addendum:
CLINICAL DATA: Neck pain. Additional history provided by scanning
technologist: Patient reports right-sided neck pain and shoulder
pain for 4 months.

EXAM:
MRI CERVICAL SPINE WITHOUT CONTRAST
TECHNIQUE: Multiplanar, multisequence MR imaging of the cervical spine was
performed. No intravenous contrast was administered.

[Series 2: T2 · sagittal · 3.0mm · 0.41mm/px · 9 of 17 slices shown (1 of 2)]
[im 1/17]
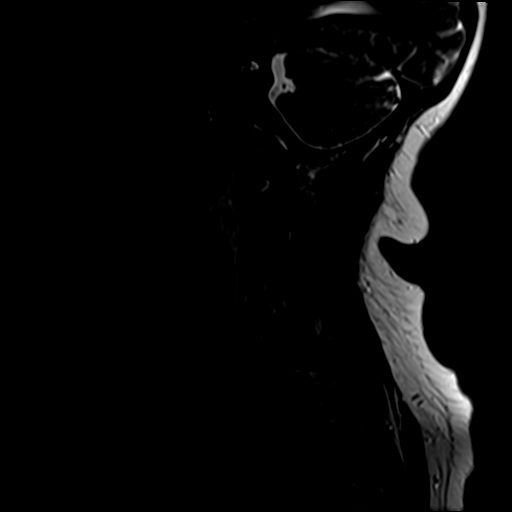
[im 3/17]
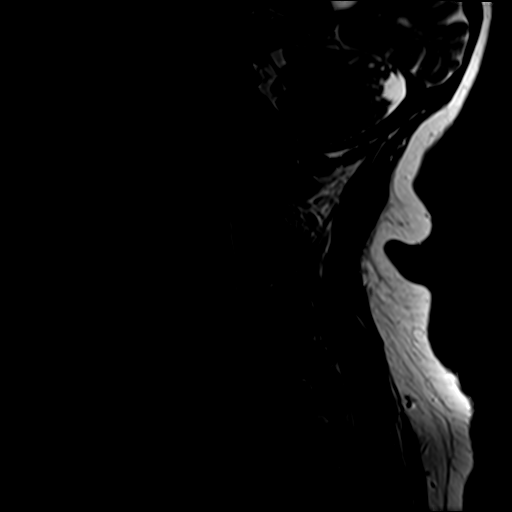
[im 5/17]
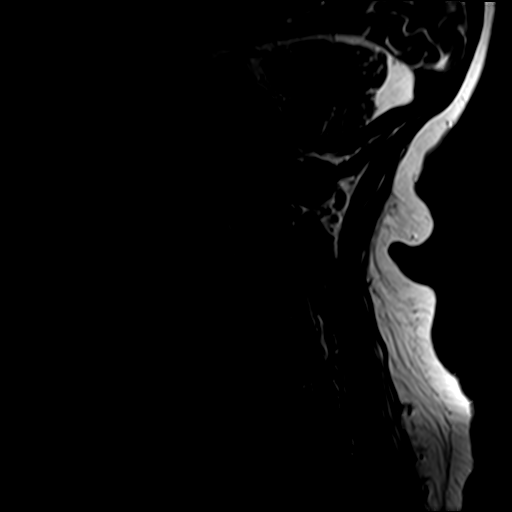
[im 7/17]
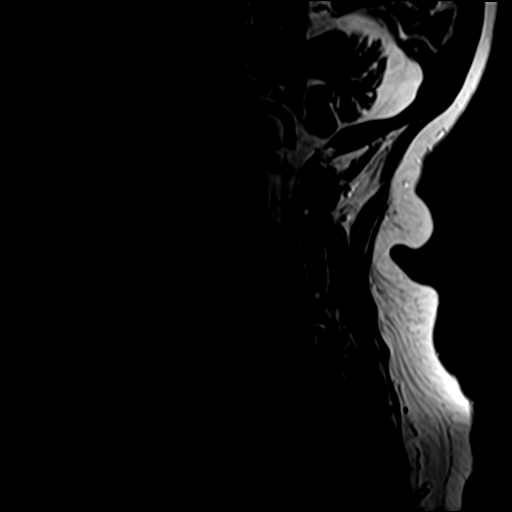
[im 9/17]
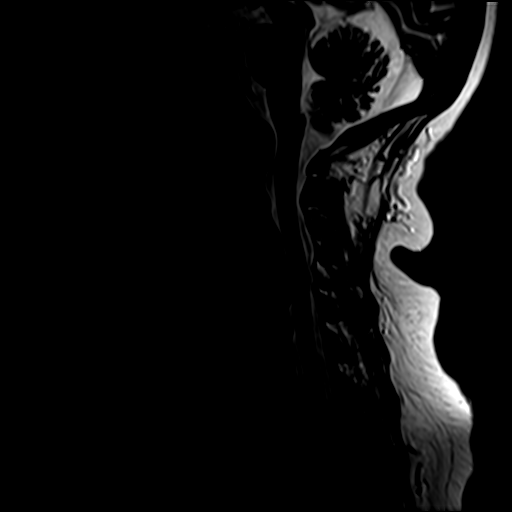
[im 11/17]
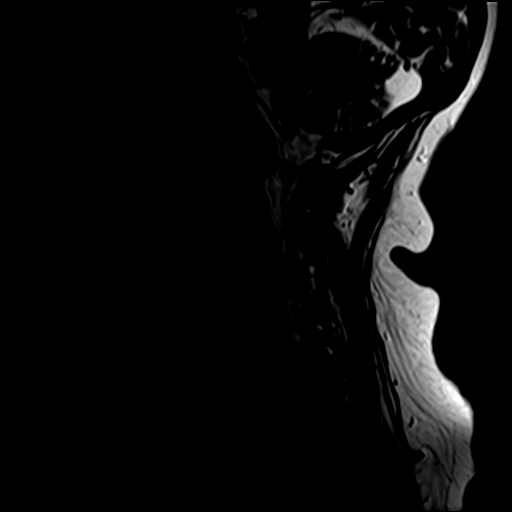
[im 13/17]
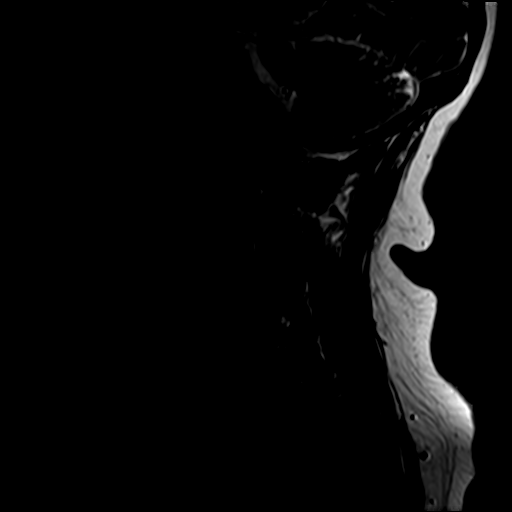
[im 15/17]
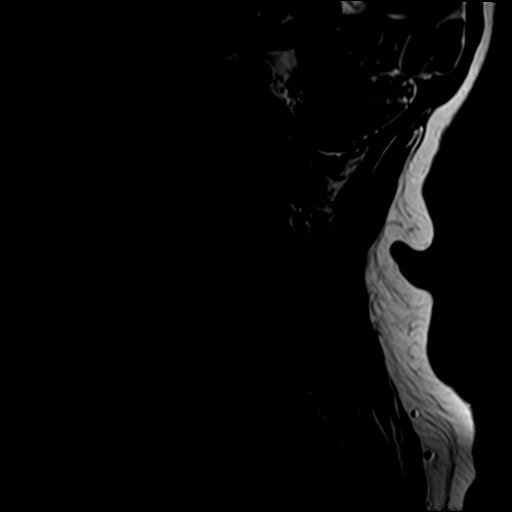
[im 17/17]
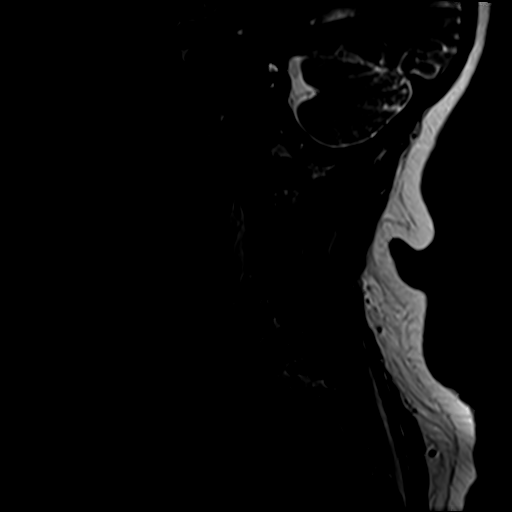

[Series 3: STIR · sagittal · 3.0mm · 0.82mm/px · 3 of 17 slices shown]
[im 3/17]
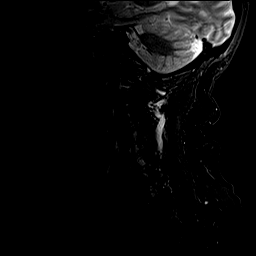
[im 9/17]
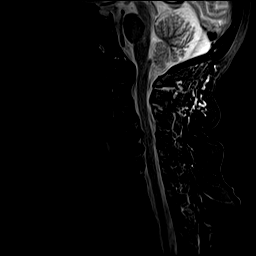
[im 15/17]
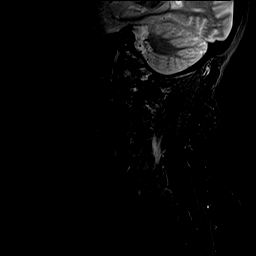

[Series 4: T1 · sagittal · 3.0mm · 0.82mm/px · 3 of 17 slices shown]
[im 3/17]
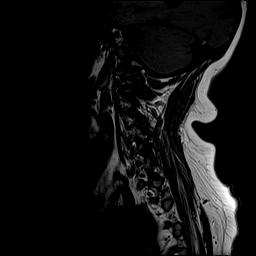
[im 10/17]
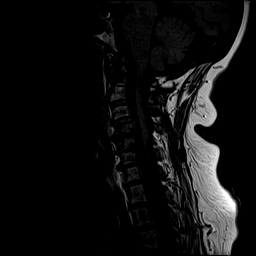
[im 14/17]
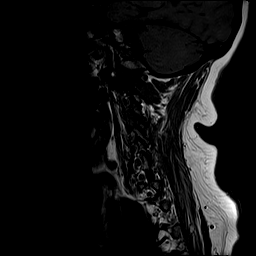

[Series 5: T2 · axial · 3.0mm · 0.70mm/px · z∈[-50,+19]mm · 7 of 23 slices shown (2 of 2)]
[im 1/23]
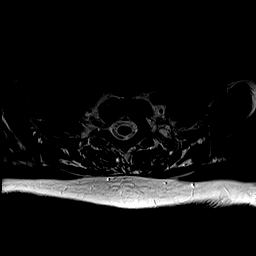
[im 3/23]
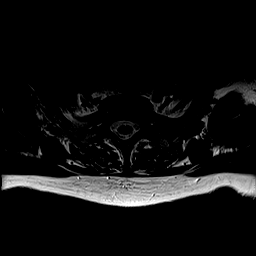
[im 5/23]
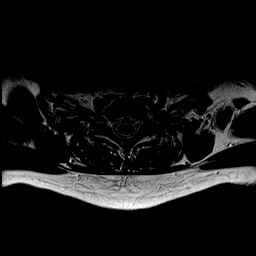
[im 7/23]
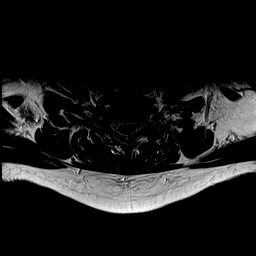
[im 9/23]
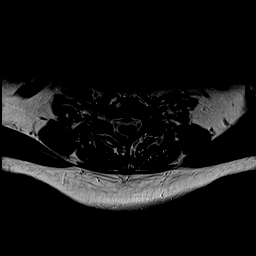
[im 12/23]
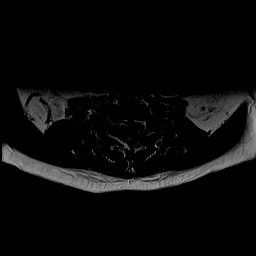
[im 20/23]
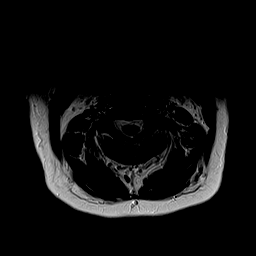

[22 of 48 positions shown; findings below may reference images not displayed]

FINDINGS: Alignment: Reversal of the expected cervical lordosis. Trace C2-C3
grade 1 anterolisthesis. 2 mm C3-C4 grade 1 anterolisthesis.

Vertebrae: Mild multifocal marrow edema within the posterior
elements and within the right C1 lateral mass, likely degenerative.
Elsewhere, no significant marrow edema or focal suspicious osseous
lesion is identified. Multilevel ventral osteophytes.

Cord: No spinal cord signal abnormality is identified. Flattening of
the ventral spinal cord at C4-C5, as described below.

Posterior Fossa, vertebral arteries, paraspinal tissues: No
abnormality identified within included portions of the posterior
fossa. Flow voids preserved within the imaged cervical vertebral
arteries. Paraspinal soft tissues unremarkable.

Disc levels:

Unless otherwise stated, the level by level findings below have not
significantly changed from the prior MRI of 10/26/2007.

Progressive multilevel disc degeneration. Most notably, there is
progressive moderate to moderately advanced disc degeneration at
C4-C5 and C5-C6.

C2-C3: Trace grade 1 anterolisthesis. Slight disc uncovering.
Progressive facet arthrosis (predominantly on the left). No
significant spinal canal stenosis. Moderate left neural foraminal
narrowing has not significantly changed.

C3-C4: 2 mm grade 1 anterolisthesis. Disc uncovering. Uncovertebral
hypertrophy on the left. Progressive facet arthrosis (greater on the
left and advanced on the left). No significant spinal canal
stenosis. Progressive moderate right neural foraminal narrowing.
Progressive moderate foraminal stenosis on the right. Redemonstrated
severe foraminal stenosis on the left.

C4-C5: Posterior disc osteophyte complex with right greater than
left disc osteophyte ridge/uncinate hypertrophy. Superimposed
central disc protrusion. Minimal facet arthrosis. There is
progressive spinal canal narrowing. The disc protrusion contacts and
mildly flattens the ventral spinal cord. However, the dorsal CSF
space is maintained within the spinal canal. Redemonstrated severe
right foraminal stenosis. Progressive moderate left foraminal
stenosis.

C5-C6: Posterior disc osteophyte complex with bilateral disc
osteophyte ridge/uncinate hypertrophy. Minimal facet arthrosis. Mild
relative spinal canal narrowing with possible contact upon the
ventral spinal cord. Progressive moderate right neural foraminal
narrowing. Redemonstrated severe left neural foraminal narrowing.

C6-C7: Posterior disc osteophyte complex with bilateral disc
osteophyte ridge/uncinate hypertrophy. A previously demonstrated
broad-based right center disc protrusion has regressed and is no
longer appreciated. Minimal facet arthrosis. Minimal relative spinal
canal narrowing (without spinal cord mass effect). Mild-to-moderate
right foraminal stenosis, unchanged. Minimal left neural foraminal
narrowing, new from the prior exam.

C7-T1: Slight disc bulge, new from the prior exam. Minimal facet
arthrosis. No significant spinal canal or foraminal stenosis.
IMPRESSION: Cervical spondylosis, as outlined and having progressed at multiple
levels since the prior MRI of 10/26/2007.

Multilevel spinal canal stenosis, most notably as follows.

At C4-C5, a posterior disc osteophyte complex and superimposed
central disc protrusion contribute to progressive spinal canal
stenosis. The disc protrusion contacts and mildly flattens the
ventral spinal cord. However, the dorsal CSF space is maintained
within the spinal canal.

At C5-C6, a posterior disc osteophyte complex mildly narrows the
spinal canal, and may contact the ventral spinal cord.

Multilevel foraminal stenosis, as detailed and greatest on the left
at C2-C3 (moderate), bilaterally at C3-C4 (moderate right, severe
left), bilaterally at C4-C5 (severe right, moderate left) and
bilaterally at C5-C6 (moderate right, severe left).

Progressive disc degeneration. Most notably, there is progressive
moderate to moderately advanced disc degeneration at C4-C5 and
C5-C6.

Mild multifocal marrow edema within the posterior elements, and
within the right C1 lateral mass, likely degenerative.

Mild reversal of the expected cervical lordosis.

ADDENDUM:
Findings omitted from the impression: Mild grade 1 anterolisthesis
at C2-C3 and C3-C4.

*** End of Addendum ***
FINDINGS: Alignment: Reversal of the expected cervical lordosis. Trace C2-C3
grade 1 anterolisthesis. 2 mm C3-C4 grade 1 anterolisthesis.

Vertebrae: Mild multifocal marrow edema within the posterior
elements and within the right C1 lateral mass, likely degenerative.
Elsewhere, no significant marrow edema or focal suspicious osseous
lesion is identified. Multilevel ventral osteophytes.

Cord: No spinal cord signal abnormality is identified. Flattening of
the ventral spinal cord at C4-C5, as described below.

Posterior Fossa, vertebral arteries, paraspinal tissues: No
abnormality identified within included portions of the posterior
fossa. Flow voids preserved within the imaged cervical vertebral
arteries. Paraspinal soft tissues unremarkable.

Disc levels:

Unless otherwise stated, the level by level findings below have not
significantly changed from the prior MRI of 10/26/2007.

Progressive multilevel disc degeneration. Most notably, there is
progressive moderate to moderately advanced disc degeneration at
C4-C5 and C5-C6.

C2-C3: Trace grade 1 anterolisthesis. Slight disc uncovering.
Progressive facet arthrosis (predominantly on the left). No
significant spinal canal stenosis. Moderate left neural foraminal
narrowing has not significantly changed.

C3-C4: 2 mm grade 1 anterolisthesis. Disc uncovering. Uncovertebral
hypertrophy on the left. Progressive facet arthrosis (greater on the
left and advanced on the left). No significant spinal canal
stenosis. Progressive moderate right neural foraminal narrowing.
Progressive moderate foraminal stenosis on the right. Redemonstrated
severe foraminal stenosis on the left.

C4-C5: Posterior disc osteophyte complex with right greater than
left disc osteophyte ridge/uncinate hypertrophy. Superimposed
central disc protrusion. Minimal facet arthrosis. There is
progressive spinal canal narrowing. The disc protrusion contacts and
mildly flattens the ventral spinal cord. However, the dorsal CSF
space is maintained within the spinal canal. Redemonstrated severe
right foraminal stenosis. Progressive moderate left foraminal
stenosis.

C5-C6: Posterior disc osteophyte complex with bilateral disc
osteophyte ridge/uncinate hypertrophy. Minimal facet arthrosis. Mild
relative spinal canal narrowing with possible contact upon the
ventral spinal cord. Progressive moderate right neural foraminal
narrowing. Redemonstrated severe left neural foraminal narrowing.

C6-C7: Posterior disc osteophyte complex with bilateral disc
osteophyte ridge/uncinate hypertrophy. A previously demonstrated
broad-based right center disc protrusion has regressed and is no
longer appreciated. Minimal facet arthrosis. Minimal relative spinal
canal narrowing (without spinal cord mass effect). Mild-to-moderate
right foraminal stenosis, unchanged. Minimal left neural foraminal
narrowing, new from the prior exam.

C7-T1: Slight disc bulge, new from the prior exam. Minimal facet
arthrosis. No significant spinal canal or foraminal stenosis.
IMPRESSION: Cervical spondylosis, as outlined and having progressed at multiple
levels since the prior MRI of 10/26/2007.

Multilevel spinal canal stenosis, most notably as follows.

At C4-C5, a posterior disc osteophyte complex and superimposed
central disc protrusion contribute to progressive spinal canal
stenosis. The disc protrusion contacts and mildly flattens the
ventral spinal cord. However, the dorsal CSF space is maintained
within the spinal canal.

At C5-C6, a posterior disc osteophyte complex mildly narrows the
spinal canal, and may contact the ventral spinal cord.

Multilevel foraminal stenosis, as detailed and greatest on the left
at C2-C3 (moderate), bilaterally at C3-C4 (moderate right, severe
left), bilaterally at C4-C5 (severe right, moderate left) and
bilaterally at C5-C6 (moderate right, severe left).

Progressive disc degeneration. Most notably, there is progressive
moderate to moderately advanced disc degeneration at C4-C5 and
C5-C6.

Mild multifocal marrow edema within the posterior elements, and
within the right C1 lateral mass, likely degenerative.

Mild reversal of the expected cervical lordosis.

## 2022-03-15 ENCOUNTER — Encounter: Payer: Self-pay | Admitting: Physician Assistant

## 2022-03-15 ENCOUNTER — Ambulatory Visit (INDEPENDENT_AMBULATORY_CARE_PROVIDER_SITE_OTHER): Payer: Medicare Other | Admitting: Physician Assistant

## 2022-03-15 DIAGNOSIS — Z86007 Personal history of in-situ neoplasm of skin: Secondary | ICD-10-CM

## 2022-03-29 ENCOUNTER — Encounter: Payer: Self-pay | Admitting: Physician Assistant

## 2022-03-29 NOTE — Progress Notes (Signed)
   Follow-Up Visit   Subjective  Felicia Cooper is a 71 y.o. female who presents for the following: Follow-up (Patient here today for a follow up from treatment of CIS on right forearm. Per patient no concerns today. ).   The following portions of the chart were reviewed this encounter and updated as appropriate:  Tobacco  Allergies  Meds  Problems  Med Hx  Surg Hx  Fam Hx      Objective  Well appearing patient in no apparent distress; mood and affect are within normal limits.  A full examination was performed including scalp, head, eyes, ears, nose, lips, neck, chest, axillae, abdomen, back, buttocks, bilateral upper extremities, bilateral lower extremities, hands, feet, fingers, toes, fingernails, and toenails. All findings within normal limits unless otherwise noted below.  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  The documentation on 03/29/22 for the exam, diagnosis, procedures and orders are all accurate and complete.

## 2022-04-04 ENCOUNTER — Ambulatory Visit: Payer: Medicare Other | Admitting: Podiatry

## 2022-04-07 ENCOUNTER — Ambulatory Visit: Payer: Medicare Other

## 2022-04-07 ENCOUNTER — Ambulatory Visit (INDEPENDENT_AMBULATORY_CARE_PROVIDER_SITE_OTHER): Payer: Medicare Other | Admitting: Podiatry

## 2022-04-07 DIAGNOSIS — M7671 Peroneal tendinitis, right leg: Secondary | ICD-10-CM | POA: Diagnosis not present

## 2022-04-07 DIAGNOSIS — Z9889 Other specified postprocedural states: Secondary | ICD-10-CM

## 2022-04-07 MED ORDER — TRIAMCINOLONE ACETONIDE 10 MG/ML IJ SUSP
10.0000 mg | Freq: Once | INTRAMUSCULAR | Status: AC
  Administered 2022-04-07: 10 mg

## 2022-04-08 NOTE — Progress Notes (Signed)
Subjective:   Patient ID: Felicia Cooper, female   DOB: 71 y.o.   MRN: [id number]   HPI Patient presents stating that her right big toe still stands up somewhat but is not hurting but she is getting pain on the outside of her foot.  States that pain has been present for a long time and she has trouble with barefoot but with shoes on does not have any issues   ROS      Objective:  Physical Exam  Neuro vascular status intact negative Bevelyn Buckles' sign noted right first MPJ does not have the motion I would like but in a nonweightbearing stance it does sit normal and then does elevate during weightbearing with swelling still noted within the plantar first metatarsal head with no pain with movement of the joint surface at all currently with pain more at the peroneal base fifth metatarsal right currently     Assessment:  Patient had a total implant right that does have some elevation unfortunately of the hallux but I am hopeful as the swelling continues to reduce during the postoperative period that will eventually come down.  Could eventually require fusion but at this point is not tender so not something necessary.  Hopefully the outside pain is something that we can resolve peroneal tendinitis     Plan:  Reviewed the whole condition and today I did do sterile prep injected the peroneal complex at base of fifth metatarsal 3 mg dexamethasone Kenalog 5 mg Xylocaine and I want to see back 6 weeks to reevaluate and see how it responded to conservative treatment

## 2022-05-23 ENCOUNTER — Ambulatory Visit (INDEPENDENT_AMBULATORY_CARE_PROVIDER_SITE_OTHER): Payer: Medicare Other

## 2022-05-23 ENCOUNTER — Encounter: Payer: Self-pay | Admitting: Podiatry

## 2022-05-23 ENCOUNTER — Ambulatory Visit (INDEPENDENT_AMBULATORY_CARE_PROVIDER_SITE_OTHER): Payer: Medicare Other | Admitting: Podiatry

## 2022-05-23 ENCOUNTER — Other Ambulatory Visit: Payer: Self-pay | Admitting: Podiatry

## 2022-05-23 DIAGNOSIS — Z9889 Other specified postprocedural states: Secondary | ICD-10-CM

## 2022-05-23 DIAGNOSIS — M7671 Peroneal tendinitis, right leg: Secondary | ICD-10-CM

## 2022-05-23 DIAGNOSIS — M205X1 Other deformities of toe(s) (acquired), right foot: Secondary | ICD-10-CM

## 2022-05-23 NOTE — Progress Notes (Signed)
Subjective:   Patient ID: Felicia Cooper, female   DOB: 71 y.o.   MRN: [id number]   HPI Patient states she is improving and gradually getting better but still does have some lifting of the big toe.  States the outside of the foot is feeling better after being treated for tendinitis at last visit     ROS      Objective:  Physical Exam  Neurovascular status intact muscle strength adequate with patient's right hallux appearing relative clinical normal but does lift up when she stands on it but seems to do well with shoe gear.  She is no longer walking to the outside of her foot there is no pain when I moved it and there is slight restriction of motion but overall it seems to be functioning well and is alleviated her initial problem of chronic pain of the joint     Assessment:  Peroneal tendinitis improving as I believe she is walking better on her foot along with medial lifting of the right hallux but at this point not symptomatic     Plan:  H&P discussed x-rays and at 1 point future it is possible that she will need a fusion of the big toe joint but since it is nonpainful currently not giving her problems I would rather hold off on doing this and I did educate her and I encouraged her to continue to walk on the inside of her foot as her swelling goes down to continue to try to strengthen the tendon.  Patient will be seen back as needed for condition  X-rays indicate it appears the implant is in place and fortunately the right hallux has lifted it which can happen with cutting of the bone and destabilization but I am hoping clinically is doing well and will not require any further revisional surgery

## 2022-07-01 NOTE — Progress Notes (Unsigned)
Assessment/Plan:    1.  Tremor  -Reassured patient today that again I saw no evidence of CBGD.  She is still worried about this because of family history in her mother.  Discussed with her that this is generally not inherited.  -She has very little tremor on examination.  Nonetheless, I asked her if she would like medication as it is bothersome.  She states that she is not ready for that yet.  We discussed various medications, and she decided to hold on that.  -Discussed that Wellbutrin can increase tremor slightly and she is going to talk to her primary care about this.   2.  Hyperreflexia  -Likely due to cervical stenosis.  She had injection and was helpful by Dr. Nelva Bush.  She still has cervical stenosis at C4-C5.  3.  Discussed with patient that I am happy to see her back on a yearly basis just to examine her if that would help, or we can see her back on an as-needed basis.  Gave her warning signs for which I would definitely want to see her back.  Discussed concerning neurologic signs and symptoms. Subjective:   Felicia Cooper was seen today in follow up for tremor.  My previous records were reviewed prior to todays visit. Pt was seen about 8 months ago, at which time she was worried about CBGD because her mom had the disease.  I saw no evidence of that.  She had come in with a chief complaint of tremor, but I did not really see a lot of tremor on examination.  She was told to follow-up if things got worse.  She was already following with orthopedics regarding her cervical spinal stenosis.   She states today that tremor is worse.  She states that she notes it when holding the mirror to look at her hair.  She notes it in both hands.  She will sometimes note mouth tremor.  She sometimes feels tremor on the inside.  Balance hasn't changed.  Only fall was when she tripped over something with hands full.      ALLERGIES:   Allergies  Allergen Reactions   Oxycodone Itching    CURRENT  MEDICATIONS:  Current Meds  Medication Sig   buPROPion (WELLBUTRIN SR) 150 MG 12 hr tablet Take 150 mg by mouth 2 (two) times daily.   fexofenadine (ALLEGRA) 180 MG tablet Take 180 mg by mouth daily.   gabapentin (NEURONTIN) 300 MG capsule Take 300 mg by mouth daily.   levothyroxine (SYNTHROID) 88 MCG tablet Take 88 mcg by mouth daily before breakfast.   omeprazole (PRILOSEC) 20 MG capsule Take 20 mg by mouth daily.   PARoxetine (PAXIL) 40 MG tablet Take 40 mg by mouth daily.   vitamin C (ASCORBIC ACID) 250 MG tablet Take 250 mg by mouth daily.      Objective:    PHYSICAL EXAMINATION:    VITALS:   Vitals:   07/05/22 1100  BP: 129/70  Pulse: 77  Resp: 18  SpO2: 99%  Weight: 214 lb (97.1 kg)  Height: 5' (1.524 m)    GEN:  The patient appears stated age and is in NAD. HEENT:  Normocephalic, atraumatic.  The mucous membranes are moist. The superficial temporal arteries are without ropiness or tenderness. CV:  RRR Lungs:  CTAB Neck/HEME:  There are no carotid bruits bilaterally.  Neurological examination:  Orientation: The patient is alert and oriented x3. Cranial nerves: There is good facial symmetry. The speech is  fluent and clear. Soft palate rises symmetrically and there is no tongue deviation. Hearing is intact to conversational tone. Sensation: Sensation is intact to light touch throughout Motor: Strength is at least antigravity x4.   Movement examination: Tone: There is normal tone in the UE/LE Abnormal movements: No rest tremor.  No significant postural or intention tremor without a weight.  When she is given the tuning fork to hold, she has very mild tremor.  When she is given the reflex hammer to hold, which is heavier than the tuning fork, no tremor is seen.  When she is given a heavier weight than this, no tremors seen.  No trouble with Archimedes spirals.  This is similar to previous Coordination:  There is no decremation with RAM's, with any form of RAMS,  including alternating supination and pronation of the forearm, hand opening and closing, finger taps, heel taps and toe taps. Gait and Station: The patient has no difficulty arising out of a deep-seated chair without the use of the hands. The patient's stride length is good I have reviewed and interpreted the following labs independently   Chemistry      Component Value Date/Time   NA 140 01/08/2021 0320   K 4.1 01/08/2021 0320   CL 107 01/08/2021 0320   CO2 28 01/08/2021 0320   BUN 20 01/08/2021 0320   CREATININE 0.83 01/08/2021 0320      Component Value Date/Time   CALCIUM 8.6 (L) 01/08/2021 0320   ALKPHOS 73 01/05/2021 0852   AST 18 01/05/2021 0852   ALT 23 01/05/2021 0852   BILITOT 0.5 01/05/2021 0852      Lab Results  Component Value Date   WBC 13.4 (H) 01/08/2021   HGB 11.2 (L) 01/08/2021   HCT 34.4 (L) 01/08/2021   MCV 93.5 01/08/2021   PLT 199 01/08/2021   No results found for: "TSH"   Chemistry      Component Value Date/Time   NA 140 01/08/2021 0320   K 4.1 01/08/2021 0320   CL 107 01/08/2021 0320   CO2 28 01/08/2021 0320   BUN 20 01/08/2021 0320   CREATININE 0.83 01/08/2021 0320      Component Value Date/Time   CALCIUM 8.6 (L) 01/08/2021 0320   ALKPHOS 73 01/05/2021 0852   AST 18 01/05/2021 0852   ALT 23 01/05/2021 0852   BILITOT 0.5 01/05/2021 0852         Total time spent on today's visit was 23 minutes, including both face-to-face time and nonface-to-face time.  Time included that spent on review of records (prior notes available to me/labs/imaging if pertinent), discussing treatment and goals, answering patient's questions and coordinating care.  Cc:  Kathyrn Lass, MD

## 2022-07-05 ENCOUNTER — Encounter: Payer: Self-pay | Admitting: Neurology

## 2022-07-05 ENCOUNTER — Ambulatory Visit (INDEPENDENT_AMBULATORY_CARE_PROVIDER_SITE_OTHER): Payer: Medicare Other | Admitting: Neurology

## 2022-07-05 VITALS — BP 129/70 | HR 77 | Resp 18 | Ht 60.0 in | Wt 214.0 lb

## 2022-07-05 DIAGNOSIS — G250 Essential tremor: Secondary | ICD-10-CM | POA: Diagnosis not present

## 2022-07-05 DIAGNOSIS — G251 Drug-induced tremor: Secondary | ICD-10-CM

## 2022-07-05 NOTE — Patient Instructions (Signed)
Essential Tremor ?A tremor is trembling or shaking that a person cannot control. Most tremors affect the hands or arms. Tremors can also affect the head, vocal cords, legs, and other parts of the body. Essential tremor is a tremor without a known cause. Usually, it occurs while a person is trying to perform an action. It tends to get worse gradually as a person ages. ?What are the causes? ?The cause of this condition is not known, but it often runs in families. ?What increases the risk? ?You are more likely to develop this condition if: ?You have a family member with essential tremor. ?You are 40 years of age or older. ?What are the signs or symptoms? ?The main sign of a tremor is a rhythmic shaking of certain parts of your body that is uncontrolled and unintentional. You may: ?Have difficulty eating with a spoon or fork. ?Have difficulty writing. ?Nod your head up and down or side to side. ?Have a quivering voice. ?The shaking may: ?Get worse over time. ?Come and go. ?Be more noticeable on one side of your body. ?Get worse due to stress, tiredness (fatigue), caffeine, and extreme heat or cold. ?How is this diagnosed? ?This condition may be diagnosed based on: ?Your symptoms and medical history. ?A physical exam. ?There is no single test to diagnose an essential tremor. However, your health care provider may order tests to rule out other causes of your condition. These may include: ?Blood and urine tests. ?Imaging studies of your brain, such as a CT scan or MRI. ?How is this treated? ?Treatment for essential tremor depends on the severity of the condition. ?Mild tremors may not need treatment if they do not affect your day-to-day life. ?Severe tremors may need to be treated using one or more of the following options: ?Medicines. ?Injections of a substance called botulinum toxin. ?Procedures such as deep brain stimulation (DBS) implantation or MRI-guided ultrasound treatment. ?Lifestyle changes. ?Occupational or  physical therapy. ?Follow these instructions at home: ?Lifestyle ? ?Do not use any products that contain nicotine or tobacco. These products include cigarettes, chewing tobacco, and vaping devices, such as e-cigarettes. If you need help quitting, ask your health care provider. ?Limit your caffeine intake as told by your health care provider. ?Try to get 8 hours of sleep each night. ?Find ways to manage your stress that fit your lifestyle and personality. Consider trying meditation or yoga. ?Try to anticipate stressful situations and allow extra time to manage them. ?If you are struggling emotionally with the effects of your tremor, consider working with a mental health provider. ?General instructions ?Take over-the-counter and prescription medicines only as told by your health care provider. ?Avoid extreme heat and extreme cold. ?Keep all follow-up visits. This is important. Visits may include physical therapy visits. ?Where to find more information ?National Institute of Neurological Disorders and Stroke: www.ninds.nih.gov ?Contact a health care provider if: ?You experience any changes in the location or intensity of your tremors. ?You start having a tremor after starting a new medicine. ?You have a tremor with other symptoms, such as: ?Numbness. ?Tingling. ?Pain. ?Weakness. ?Your tremor gets worse. ?Your tremor interferes with your daily life. ?You feel down, blue, or sad for at least 2 weeks in a row. ?Worrying about your tremor and what other people think about you interferes with your everyday life functions, including relationships, work, or school. ?Summary ?Essential tremor is a tremor without a known cause. Usually, it occurs when you are trying to perform an action. ?You are more likely   to develop this condition if you have a family member with essential tremor. ?The main sign of a tremor is a rhythmic shaking of certain parts of your body that is uncontrolled and unintentional. ?Treatment for essential  tremor depends on the severity of the condition. ?This information is not intended to replace advice given to you by your health care provider. Make sure you discuss any questions you have with your health care provider. ?Document Revised: 05/28/2021 Document Reviewed: 05/28/2021 ?Elsevier Patient Education ? 2023 [employer] ? ?

## 2023-09-30 DIAGNOSIS — R051 Acute cough: Secondary | ICD-10-CM | POA: Diagnosis not present

## 2023-09-30 DIAGNOSIS — J158 Pneumonia due to other specified bacteria: Secondary | ICD-10-CM | POA: Diagnosis not present

## 2023-10-22 DIAGNOSIS — R0600 Dyspnea, unspecified: Secondary | ICD-10-CM | POA: Diagnosis not present

## 2023-10-22 DIAGNOSIS — J209 Acute bronchitis, unspecified: Secondary | ICD-10-CM | POA: Diagnosis not present

## 2023-10-22 DIAGNOSIS — U071 COVID-19: Secondary | ICD-10-CM | POA: Diagnosis not present

## 2023-12-14 DIAGNOSIS — E039 Hypothyroidism, unspecified: Secondary | ICD-10-CM | POA: Diagnosis not present

## 2023-12-14 DIAGNOSIS — E7800 Pure hypercholesterolemia, unspecified: Secondary | ICD-10-CM | POA: Diagnosis not present

## 2023-12-20 DIAGNOSIS — E7800 Pure hypercholesterolemia, unspecified: Secondary | ICD-10-CM | POA: Diagnosis not present

## 2023-12-20 DIAGNOSIS — E039 Hypothyroidism, unspecified: Secondary | ICD-10-CM | POA: Diagnosis not present

## 2023-12-20 DIAGNOSIS — F3341 Major depressive disorder, recurrent, in partial remission: Secondary | ICD-10-CM | POA: Diagnosis not present

## 2023-12-27 DIAGNOSIS — R296 Repeated falls: Secondary | ICD-10-CM | POA: Diagnosis not present

## 2023-12-27 DIAGNOSIS — R531 Weakness: Secondary | ICD-10-CM | POA: Diagnosis not present

## 2024-01-02 DIAGNOSIS — R296 Repeated falls: Secondary | ICD-10-CM | POA: Diagnosis not present

## 2024-01-02 DIAGNOSIS — R531 Weakness: Secondary | ICD-10-CM | POA: Diagnosis not present

## 2024-01-05 DIAGNOSIS — R296 Repeated falls: Secondary | ICD-10-CM | POA: Diagnosis not present

## 2024-01-05 DIAGNOSIS — R531 Weakness: Secondary | ICD-10-CM | POA: Diagnosis not present

## 2024-01-08 DIAGNOSIS — L814 Other melanin hyperpigmentation: Secondary | ICD-10-CM | POA: Diagnosis not present

## 2024-01-08 DIAGNOSIS — C4441 Basal cell carcinoma of skin of scalp and neck: Secondary | ICD-10-CM | POA: Diagnosis not present

## 2024-01-08 DIAGNOSIS — L821 Other seborrheic keratosis: Secondary | ICD-10-CM | POA: Diagnosis not present

## 2024-01-08 DIAGNOSIS — D485 Neoplasm of uncertain behavior of skin: Secondary | ICD-10-CM | POA: Diagnosis not present

## 2024-01-08 DIAGNOSIS — Z08 Encounter for follow-up examination after completed treatment for malignant neoplasm: Secondary | ICD-10-CM | POA: Diagnosis not present

## 2024-01-08 DIAGNOSIS — Z85828 Personal history of other malignant neoplasm of skin: Secondary | ICD-10-CM | POA: Diagnosis not present

## 2024-01-08 DIAGNOSIS — D225 Melanocytic nevi of trunk: Secondary | ICD-10-CM | POA: Diagnosis not present

## 2024-01-11 DIAGNOSIS — R296 Repeated falls: Secondary | ICD-10-CM | POA: Diagnosis not present

## 2024-01-11 DIAGNOSIS — R531 Weakness: Secondary | ICD-10-CM | POA: Diagnosis not present

## 2024-01-16 DIAGNOSIS — R531 Weakness: Secondary | ICD-10-CM | POA: Diagnosis not present

## 2024-01-16 DIAGNOSIS — R296 Repeated falls: Secondary | ICD-10-CM | POA: Diagnosis not present

## 2024-01-18 DIAGNOSIS — R296 Repeated falls: Secondary | ICD-10-CM | POA: Diagnosis not present

## 2024-01-18 DIAGNOSIS — R531 Weakness: Secondary | ICD-10-CM | POA: Diagnosis not present

## 2024-01-20 DIAGNOSIS — F3341 Major depressive disorder, recurrent, in partial remission: Secondary | ICD-10-CM | POA: Diagnosis not present

## 2024-01-20 DIAGNOSIS — E039 Hypothyroidism, unspecified: Secondary | ICD-10-CM | POA: Diagnosis not present

## 2024-01-20 DIAGNOSIS — E7800 Pure hypercholesterolemia, unspecified: Secondary | ICD-10-CM | POA: Diagnosis not present

## 2024-02-01 DIAGNOSIS — C4441 Basal cell carcinoma of skin of scalp and neck: Secondary | ICD-10-CM | POA: Diagnosis not present

## 2024-06-18 ENCOUNTER — Other Ambulatory Visit (HOSPITAL_BASED_OUTPATIENT_CLINIC_OR_DEPARTMENT_OTHER): Payer: Self-pay | Admitting: Family Medicine

## 2024-06-18 DIAGNOSIS — K219 Gastro-esophageal reflux disease without esophagitis: Secondary | ICD-10-CM | POA: Diagnosis not present

## 2024-06-18 DIAGNOSIS — F3340 Major depressive disorder, recurrent, in remission, unspecified: Secondary | ICD-10-CM | POA: Diagnosis not present

## 2024-06-18 DIAGNOSIS — S0990XA Unspecified injury of head, initial encounter: Secondary | ICD-10-CM | POA: Diagnosis not present

## 2024-06-18 DIAGNOSIS — M545 Low back pain, unspecified: Secondary | ICD-10-CM | POA: Diagnosis not present

## 2024-06-18 DIAGNOSIS — Z23 Encounter for immunization: Secondary | ICD-10-CM | POA: Diagnosis not present

## 2024-06-18 DIAGNOSIS — E7800 Pure hypercholesterolemia, unspecified: Secondary | ICD-10-CM | POA: Diagnosis not present

## 2024-06-18 DIAGNOSIS — R42 Dizziness and giddiness: Secondary | ICD-10-CM | POA: Diagnosis not present

## 2024-06-18 DIAGNOSIS — E039 Hypothyroidism, unspecified: Secondary | ICD-10-CM | POA: Diagnosis not present

## 2024-06-20 ENCOUNTER — Ambulatory Visit (HOSPITAL_BASED_OUTPATIENT_CLINIC_OR_DEPARTMENT_OTHER)
Admission: RE | Admit: 2024-06-20 | Discharge: 2024-06-20 | Disposition: A | Discharge status: Home / Self Care | Source: Ambulatory Visit | Attending: Family Medicine | Admitting: Family Medicine

## 2024-06-20 DIAGNOSIS — S0990XA Unspecified injury of head, initial encounter: Secondary | ICD-10-CM | POA: Diagnosis not present

## 2024-07-12 DIAGNOSIS — R197 Diarrhea, unspecified: Secondary | ICD-10-CM | POA: Diagnosis not present

## 2024-07-12 DIAGNOSIS — A020 Salmonella enteritis: Secondary | ICD-10-CM | POA: Diagnosis not present
